# Patient Record
Sex: Male | Born: 1995 | Race: White | Hispanic: No | Marital: Single | Smoking: Never smoker
Health system: Southern US, Community
[De-identification: ages and names within clinical notes are randomized; demographics above are authoritative.]

---

## 2019-10-03 ENCOUNTER — Other Ambulatory Visit: Payer: Self-pay | Admitting: *Deleted

## 2019-10-03 DIAGNOSIS — Z20822 Contact with and (suspected) exposure to covid-19: Secondary | ICD-10-CM

## 2019-10-04 LAB — NOVEL CORONAVIRUS, NAA: SARS-CoV-2, NAA: NOT DETECTED

## 2020-02-14 ENCOUNTER — Ambulatory Visit
Admission: EM | Admit: 2020-02-14 | Discharge: 2020-02-14 | Disposition: A | Discharge status: Home / Self Care | Payer: Worker's Compensation | Attending: Emergency Medicine | Admitting: Emergency Medicine

## 2020-02-14 ENCOUNTER — Other Ambulatory Visit: Payer: Self-pay

## 2020-02-14 DIAGNOSIS — M545 Low back pain: Secondary | ICD-10-CM

## 2020-02-14 DIAGNOSIS — M79606 Pain in leg, unspecified: Secondary | ICD-10-CM

## 2020-02-14 MED ORDER — KETOROLAC TROMETHAMINE 60 MG/2ML IM SOLN
60.0000 mg | Freq: Once | INTRAMUSCULAR | Status: AC
  Administered 2020-02-14: 60 mg via INTRAMUSCULAR

## 2020-02-14 MED ORDER — IBUPROFEN 800 MG PO TABS: 800.0000 mg | ORAL_TABLET | Freq: Three times a day (TID) | ORAL | 0 refills | Status: DC

## 2020-02-14 MED ORDER — CYCLOBENZAPRINE HCL 10 MG PO TABS: 10.0000 mg | ORAL_TABLET | Freq: Two times a day (BID) | ORAL | 0 refills | Status: DC | PRN

## 2020-02-14 MED ORDER — PREDNISONE 10 MG PO TABS: 20.0000 mg | ORAL_TABLET | Freq: Every day | ORAL | 0 refills | Status: DC

## 2020-02-14 MED ORDER — METHYLPREDNISOLONE SODIUM SUCC 125 MG IJ SOLR
125.0000 mg | Freq: Once | INTRAMUSCULAR | Status: AC
  Administered 2020-02-14: 125 mg via INTRAMUSCULAR

## 2020-02-14 NOTE — ED Provider Notes (Signed)
[facility]    CSN: [id number] Arrival date & time: 02/14/20  1908      History   Chief Complaint Chief Complaint  Patient presents with  . Back Pain    HPI John Sims is a 24 y.o. male.   Who presented to the urgent care with a complaint of lower back pain that radiates to the lower bilateral leg for the past 5 days.  It started after he jumped off a platform that is 5 feet high.  He localizes the pain to the lower back.  He describes the pain as constant and achy.  He has tried OTC medications without relief.  His symptoms are made worse with ROM.  He denies similar symptoms in the past.    The history is provided by the patient. No language interpreter was used.  Back Pain   History reviewed. No pertinent past medical history.  There are no problems to display for this patient.   History reviewed. No pertinent surgical history.     Home Medications    Prior to Admission medications   Medication Sig Start Date End Date Taking? Authorizing Provider  cyclobenzaprine (FLEXERIL) 10 MG tablet Take 1 tablet (10 mg total) by mouth 2 (two) times daily as needed for muscle spasms. 02/14/20   Dannika Hilgeman, Darrelyn Hillock, FNP  ibuprofen (ADVIL) 800 MG tablet Take 1 tablet (800 mg total) by mouth 3 (three) times daily. Take with food 02/14/20   Arleene Settle, Darrelyn Hillock, FNP  predniSONE (DELTASONE) 10 MG tablet Take 2 tablets (20 mg total) by mouth daily. 02/14/20   Tran Arzuaga, Darrelyn Hillock, FNP    Family History No family history on file.  Social History Social History   Tobacco Use  . Smoking status: Never Smoker  . Smokeless tobacco: Never Used  Substance Use Topics  . Alcohol use: Yes    Comment: occ  . Drug use: Never     Allergies   Patient has no known allergies.   Review of Systems Review of Systems  Constitutional: Negative.   Respiratory: Negative.   Cardiovascular: Negative.   Musculoskeletal: Positive for back pain.  All other systems reviewed and are  negative.    Physical Exam Triage Vital Signs ED Triage Vitals  Enc Vitals Group     BP 02/14/20 1930 127/86     Pulse Rate 02/14/20 1930 90     Resp 02/14/20 1930 20     Temp 02/14/20 1930 99.1 F (37.3 C)     Temp Source 02/14/20 1930 Oral     SpO2 02/14/20 1930 97 %     Weight 02/14/20 1928 230 lb (104.3 kg)     Height 02/14/20 1928 6\' 1"  (1.854 m)     Head Circumference --      Peak Flow --      Pain Score 02/14/20 1928 8     Pain Loc --      Pain Edu? --      Excl. in [place]? --    No data found.  Updated Vital Signs BP 127/86 (BP Location: Left Arm)   Pulse 90   Temp 99.1 F (37.3 C) (Oral)   Resp 20   Ht 6\' 1"  (1.854 m)   Wt 230 lb (104.3 kg)   SpO2 97%   BMI 30.34 kg/m   Visual Acuity Right Eye Distance:   Left Eye Distance:   Bilateral Distance:    Right Eye Near:   Left Eye Near:  Bilateral Near:     Physical Exam Vitals and nursing note reviewed.  Constitutional:      General: He is not in acute distress.    Appearance: Normal appearance. He is normal weight. He is not ill-appearing, toxic-appearing or diaphoretic.  Cardiovascular:     Rate and Rhythm: Normal rate and regular rhythm.     Pulses: Normal pulses.     Heart sounds: Normal heart sounds. No murmur.  Pulmonary:     Effort: Pulmonary effort is normal. No respiratory distress.     Breath sounds: Normal breath sounds. No stridor. No wheezing, rhonchi or rales.  Chest:     Chest wall: No tenderness.  Musculoskeletal:        General: Tenderness present. Normal range of motion.     Cervical back: Normal.     Thoracic back: Normal.     Lumbar back: Spasms and tenderness present. No swelling or edema.     Right lower leg: No edema.     Left lower leg: No edema.  Neurological:     General: No focal deficit present.     Mental Status: He is alert and oriented to person, place, and time.     Cranial Nerves: Cranial nerves are intact.     Sensory: Sensation is intact.     Motor: Motor  function is intact.     Coordination: Coordination is intact.     Gait: Gait is intact.      UC Treatments / Results  Labs (all labs ordered are listed, but only abnormal results are displayed) Labs Reviewed - No data to display  EKG   Radiology No results found.  Procedures Procedures (including critical care time)  Medications Ordered in UC Medications  ketorolac (TORADOL) injection 60 mg (has no administration in time range)  methylPREDNISolone sodium succinate (SOLU-MEDROL) 125 mg/2 mL injection 125 mg (has no administration in time range)    Initial Impression / Assessment and Plan / UC Course  I have reviewed the triage vital signs and the nursing notes.  Pertinent labs & imaging results that were available during my care of the patient were reviewed by me and considered in my medical decision making (see chart for details).     Patient is stable at discharge. Ibuprofen was prescribed for pain Flexeril was prescribed Prednisone was prescribed Solu-Medrol 125 mg IM and Toradol 60 mg IM was given in office Advised patient to follow-up with primary care Return for worsening of symptoms   Final Clinical Impressions(s) / UC Diagnoses   Final diagnoses:  Low back pain radiating to lower extremity     Discharge Instructions     Rest, ice and heat as needed Ensure adequate ROM as tolerated. Prescribed ibuprofen as needed for inflammation and pain relief Prescribed flexeril  for muscle spasm.  Do not drive or operate heavy machinery while taking this medication Return here or go to ER if you have any new or worsening symptoms such as numbness/tingling of the inner thighs, loss of bladder or bowel control, headache/blurry vision, nausea/vomiting, confusion/altered mental status, dizziness, weakness, passing out, imbalance, etc...      ED Prescriptions    Medication Sig Dispense Auth. Provider   predniSONE (DELTASONE) 10 MG tablet Take 2 tablets (20 mg total)  by mouth daily. 15 tablet Dariela Stoker, Zachery Dakins, FNP   cyclobenzaprine (FLEXERIL) 10 MG tablet Take 1 tablet (10 mg total) by mouth 2 (two) times daily as needed for muscle spasms. 20 tablet Laurian Edrington, March Rummage  S, FNP   ibuprofen (ADVIL) 800 MG tablet Take 1 tablet (800 mg total) by mouth 3 (three) times daily. Take with food 42 tablet Torrence Hammack, Zachery Dakins, FNP     PDMP not reviewed this encounter.   Durward Parcel, FNP 02/14/20 1952

## 2020-02-14 NOTE — ED Triage Notes (Signed)
Pt reports was at work and jumped off of a platform that was approx 22feet high.  C/O pain in lower back radiating down both legs.

## 2020-02-14 NOTE — Discharge Instructions (Signed)
Rest, ice and heat as needed Ensure adequate ROM as tolerated. Prescribed ibuprofen as needed for inflammation and pain relief Prescribed flexeril  for muscle spasm.  Do not drive or operate heavy machinery while taking this medication Return here or go to ER if you have any new or worsening symptoms such as numbness/tingling of the inner thighs, loss of bladder or bowel control, headache/blurry vision, nausea/vomiting, confusion/altered mental status, dizziness, weakness, passing out, imbalance, etc...   

## 2021-01-18 ENCOUNTER — Ambulatory Visit
Admission: EM | Admit: 2021-01-18 | Discharge: 2021-01-18 | Disposition: A | Discharge status: Home / Self Care | Payer: Self-pay | Attending: Family Medicine | Admitting: Family Medicine

## 2021-01-18 ENCOUNTER — Other Ambulatory Visit: Payer: Self-pay

## 2021-01-18 ENCOUNTER — Encounter: Payer: Self-pay | Admitting: Emergency Medicine

## 2021-01-18 DIAGNOSIS — Z20822 Contact with and (suspected) exposure to covid-19: Secondary | ICD-10-CM

## 2021-01-18 DIAGNOSIS — J069 Acute upper respiratory infection, unspecified: Secondary | ICD-10-CM

## 2021-01-18 NOTE — ED Triage Notes (Signed)
Scratchy throat started on Tuesday.  Did home covid test that was negative.

## 2021-01-18 NOTE — ED Provider Notes (Signed)
[facility]   [id number] 01/18/21 Arrival Time: 1507   CC: COVID symptoms  SUBJECTIVE: History from: patient.  John Sims is a 25 y.o. male who presents with scratchy throat that started 5 days ago.  States that he had a negative home COVID test.  States that he feels nauseated and lightheaded right now.. Denies sick exposure to COVID, flu or strep. Denies recent travel. Has negative history of Covid. Has not completed Covid vaccines. Has not taken OTC cough and cold medications for this with some improvement. Denies previous symptoms in the past. Denies fever, chills, fatigue, sinus pain, rhinorrhea, SOB, wheezing, chest pain, nausea, changes in bowel or bladder habits.    ROS: As per HPI.  All other pertinent ROS negative.     History reviewed. No pertinent past medical history. History reviewed. No pertinent surgical history. No Known Allergies No current facility-administered medications on file prior to encounter.   Current Outpatient Medications on File Prior to Encounter  Medication Sig Dispense Refill   cyclobenzaprine (FLEXERIL) 10 MG tablet Take 1 tablet (10 mg total) by mouth 2 (two) times daily as needed for muscle spasms. 20 tablet 0   ibuprofen (ADVIL) 800 MG tablet Take 1 tablet (800 mg total) by mouth 3 (three) times daily. Take with food 42 tablet 0   predniSONE (DELTASONE) 10 MG tablet Take 2 tablets (20 mg total) by mouth daily. 15 tablet 0   Social History   Socioeconomic History   Marital status: Single    Spouse name: Not on file   Number of children: Not on file   Years of education: Not on file   Highest education level: Not on file  Occupational History   Not on file  Tobacco Use   Smoking status: Never Smoker   Smokeless tobacco: Never Used  Vaping Use   Vaping Use: Never used  Substance and Sexual Activity   Alcohol use: Yes    Comment: occ   Drug use: Never   Sexual activity: Not on file  Other Topics Concern    Not on file  Social History Narrative   Not on file   Social Determinants of Health   Financial Resource Strain: Not on file  Food Insecurity: Not on file  Transportation Needs: Not on file  Physical Activity: Not on file  Stress: Not on file  Social Connections: Not on file  Intimate Partner Violence: Not on file   History reviewed. No pertinent family history.  OBJECTIVE:  Vitals:   01/18/21 1526  BP: 117/77  Pulse: (!) 105  Resp: 18  Temp: 98.4 F (36.9 C)  TempSrc: Oral  SpO2: 96%     General appearance: alert; appears fatigued, but nontoxic; speaking in full sentences and tolerating own secretions HEENT: NCAT; Ears: EACs clear, TMs pearly gray; Eyes: PERRL.  EOM grossly intact. Sinuses: nontender; Nose: nares patent with clear rhinorrhea, Throat: oropharynx erythematous, cobblestoning present, tonsils non erythematous or enlarged, uvula midline  Neck: supple without LAD Lungs: unlabored respirations, symmetrical air entry; cough: absent; no respiratory distress; CTAB Heart: regular rate and rhythm.  Radial pulses 2+ symmetrical bilaterally Skin: warm and dry Psychological: alert and cooperative; normal mood and affect  LABS:  No results found for this or any previous visit (from the past 24 hour(s)).   ASSESSMENT & PLAN:  1. Viral URI with cough   2. Exposure to COVID-19 virus     Continue supportive care at home COVID and flu testing ordered.  It will  take between 2-3 days for test results. Someone will contact you regarding abnormal results.   Work note provided Patient should remain in quarantine until they have received Covid results.  If negative you may resume normal activities (go back to work/school) while practicing hand hygiene, social distance, and mask wearing.  If positive, patient should remain in quarantine for at least 5 days from symptom onset AND greater than 72 hours after symptoms resolution (absence of fever without the use of  fever-reducing medication and improvement in respiratory symptoms), whichever is longer Get plenty of rest and push fluids Use OTC zyrtec for nasal congestion, runny nose, and/or sore throat Use OTC flonase for nasal congestion and runny nose Use medications daily for symptom relief Use OTC medications like ibuprofen or tylenol as needed fever or pain Call or go to the ED if you have any new or worsening symptoms such as fever, worsening cough, shortness of breath, chest tightness, chest pain, turning blue, changes in mental status.  Reviewed expectations re: course of current medical issues. Questions answered. Outlined signs and symptoms indicating need for more acute intervention. Patient verbalized understanding. After Visit Summary given.         Moshe Cipro, NP 01/19/21 0930

## 2021-01-18 NOTE — ED Triage Notes (Signed)
Feels nauseated and lightheaded right now.

## 2021-01-18 NOTE — Discharge Instructions (Signed)
Your COVID test is pending.  You should self quarantine until the test result is back.    Take Tylenol or ibuprofen as needed for fever or discomfort.  Rest and keep yourself hydrated.    Follow-up with your primary care provider if your symptoms are not improving.     

## 2021-01-19 LAB — COVID-19, FLU A+B NAA
Influenza A, NAA: NOT DETECTED
Influenza B, NAA: NOT DETECTED
SARS-CoV-2, NAA: DETECTED — AB

## 2021-02-13 ENCOUNTER — Encounter: Payer: Self-pay | Admitting: Emergency Medicine

## 2021-02-13 ENCOUNTER — Other Ambulatory Visit: Payer: Self-pay

## 2021-02-13 ENCOUNTER — Ambulatory Visit
Admission: EM | Admit: 2021-02-13 | Discharge: 2021-02-13 | Disposition: A | Discharge status: Home / Self Care | Payer: Self-pay | Attending: Emergency Medicine | Admitting: Emergency Medicine

## 2021-02-13 DIAGNOSIS — K0889 Other specified disorders of teeth and supporting structures: Secondary | ICD-10-CM

## 2021-02-13 DIAGNOSIS — K029 Dental caries, unspecified: Secondary | ICD-10-CM

## 2021-02-13 DIAGNOSIS — K047 Periapical abscess without sinus: Secondary | ICD-10-CM

## 2021-02-13 MED ORDER — AMOXICILLIN-POT CLAVULANATE 875-125 MG PO TABS: 1.0000 | ORAL_TABLET | Freq: Two times a day (BID) | ORAL | 0 refills | Status: DC

## 2021-02-13 MED ORDER — NAPROXEN 500 MG PO TABS: 500.0000 mg | ORAL_TABLET | Freq: Two times a day (BID) | ORAL | 0 refills | Status: DC

## 2021-02-13 MED ORDER — CHLORHEXIDINE GLUCONATE 0.12 % MT SOLN: 15.0000 mL | Freq: Two times a day (BID) | OROMUCOSAL | 0 refills | Status: DC

## 2021-02-13 NOTE — ED Provider Notes (Signed)
[facility]   [id number] 02/13/21 Arrival Time: 1016  CC: DENTAL PAIN  SUBJECTIVE:  John Sims is a 25 y.o. male who presents to the urgent care with a complaint of dental abscess for the past few days.  Denies a precipitating event or trauma.  Localizes pain to right lower gum.  Has tried OTC analgesics without relief.  Worse with chewing.  Does not see a dentist regularly.  Report similar symptoms in the past.  Denies fever, chills, dysphagia, odynophagia, oral or neck swelling, nausea, vomiting, chest pain, SOB.    ROS: As per HPI.  All other pertinent ROS negative.     History reviewed. No pertinent past medical history. History reviewed. No pertinent surgical history. No Known Allergies No current facility-administered medications on file prior to encounter.   Current Outpatient Medications on File Prior to Encounter  Medication Sig Dispense Refill  . cyclobenzaprine (FLEXERIL) 10 MG tablet Take 1 tablet (10 mg total) by mouth 2 (two) times daily as needed for muscle spasms. 20 tablet 0  . ibuprofen (ADVIL) 800 MG tablet Take 1 tablet (800 mg total) by mouth 3 (three) times daily. Take with food 42 tablet 0  . predniSONE (DELTASONE) 10 MG tablet Take 2 tablets (20 mg total) by mouth daily. 15 tablet 0   Social History   Socioeconomic History  . Marital status: Single    Spouse name: Not on file  . Number of children: Not on file  . Years of education: Not on file  . Highest education level: Not on file  Occupational History  . Not on file  Tobacco Use  . Smoking status: Never Smoker  . Smokeless tobacco: Never Used  Vaping Use  . Vaping Use: Never used  Substance and Sexual Activity  . Alcohol use: Yes    Comment: occ  . Drug use: Never  . Sexual activity: Not on file  Other Topics Concern  . Not on file  Social History Narrative  . Not on file   Social Determinants of Health   Financial Resource Strain: Not on file  Food Insecurity: Not on  file  Transportation Needs: Not on file  Physical Activity: Not on file  Stress: Not on file  Social Connections: Not on file  Intimate Partner Violence: Not on file   No family history on file.  OBJECTIVE:  Vitals:   02/13/21 1036  BP: 116/70  Pulse: 78  Resp: 18  Temp: 98.5 F (36.9 C)  TempSrc: Oral  SpO2: 98%    Physical Exam Vitals and nursing note reviewed.  Constitutional:      General: He is not in acute distress.    Appearance: Normal appearance. He is normal weight. He is not ill-appearing, toxic-appearing or diaphoretic.  HENT:     Head: Normocephalic.     Right Ear: Tympanic membrane, ear canal and external ear normal.     Left Ear: Tympanic membrane, ear canal and external ear normal.     Mouth/Throat:     Lips: Pink.     Mouth: Mucous membranes are moist.     Dentition: Abnormal dentition. Dental caries and dental abscesses present.     Pharynx: Uvula midline.     Tonsils: No tonsillar exudate or tonsillar abscesses. 1+ on the right. 1+ on the left.  Cardiovascular:     Rate and Rhythm: Normal rate and regular rhythm.     Pulses: Normal pulses.     Heart sounds: Normal heart sounds. No murmur  heard. No friction rub. No gallop.   Pulmonary:     Effort: Pulmonary effort is normal. No respiratory distress.     Breath sounds: Normal breath sounds. No stridor. No wheezing, rhonchi or rales.  Chest:     Chest wall: No tenderness.  Neurological:     Mental Status: He is alert and oriented to person, place, and time.      ASSESSMENT & PLAN:  1. Pain, dental   2. Dental abscess   3. Dental caries     Meds ordered this encounter  Medications  . naproxen (NAPROSYN) 500 MG tablet    Sig: Take 1 tablet (500 mg total) by mouth 2 (two) times daily.    Dispense:  30 tablet    Refill:  0  . amoxicillin-clavulanate (AUGMENTIN) 875-125 MG tablet    Sig: Take 1 tablet by mouth every 12 (twelve) hours.    Dispense:  14 tablet    Refill:  0  .  chlorhexidine (PERIDEX) 0.12 % solution    Sig: Use as directed 15 mLs in the mouth or throat 2 (two) times daily.    Dispense:  120 mL    Refill:  0    Discharge Instructions  Naproxen prescribed.  Use as directed for pain relief Chlorhexidine and Augmentin were prescribed Recommend soft diet until evaluated by dentist Maintain oral hygiene care Follow up with dentist as soon as possible for further evaluation and treatment  Return or go to the ED if you have any new or worsening symptoms such as fever, chills, difficulty swallowing, painful swallowing, oral or neck swelling, nausea, vomiting, chest pain, SOB, etc...  Reviewed expectations re: course of current medical issues. Questions answered. Outlined signs and symptoms indicating need for more acute intervention. Patient verbalized understanding. After Visit Summary given.   Durward Parcel, FNP 02/13/21 1100

## 2021-02-13 NOTE — Discharge Instructions (Addendum)
Naproxen prescribed.  Use as directed for pain relief Chlorhexidine and Augmentin were prescribed Recommend soft diet until evaluated by dentist Maintain oral hygiene care Follow up with dentist as soon as possible for further evaluation and treatment  Return or go to the ED if you have any new or worsening symptoms such as fever, chills, difficulty swallowing, painful swallowing, oral or neck swelling, nausea, vomiting, chest pain, SOB, etc..Marland Kitchen

## 2021-02-13 NOTE — ED Triage Notes (Signed)
Dental abscess to RT upper side since last night

## 2022-03-14 ENCOUNTER — Other Ambulatory Visit: Payer: Self-pay

## 2022-03-14 ENCOUNTER — Emergency Department (HOSPITAL_COMMUNITY)
Admission: EM | Admit: 2022-03-14 | Discharge: 2022-03-15 | Disposition: A | Discharge status: Home / Self Care | Payer: Self-pay | Attending: Emergency Medicine | Admitting: Emergency Medicine

## 2022-03-14 ENCOUNTER — Encounter (HOSPITAL_COMMUNITY): Payer: Self-pay | Admitting: *Deleted

## 2022-03-14 DIAGNOSIS — Z79899 Other long term (current) drug therapy: Secondary | ICD-10-CM | POA: Insufficient documentation

## 2022-03-14 DIAGNOSIS — R519 Headache, unspecified: Secondary | ICD-10-CM | POA: Insufficient documentation

## 2022-03-14 DIAGNOSIS — R791 Abnormal coagulation profile: Secondary | ICD-10-CM | POA: Insufficient documentation

## 2022-03-14 DIAGNOSIS — H532 Diplopia: Secondary | ICD-10-CM | POA: Insufficient documentation

## 2022-03-14 NOTE — ED Triage Notes (Signed)
Pt with double vision to right eye since this morning.  Pt has been seen by urgent Care in [place] and was discharge and sent to a hospital to be assessed.  ?

## 2022-03-15 ENCOUNTER — Emergency Department (HOSPITAL_COMMUNITY): Payer: Self-pay

## 2022-03-15 DIAGNOSIS — Z0100 Encounter for examination of eyes and vision without abnormal findings: Secondary | ICD-10-CM

## 2022-03-15 LAB — URINALYSIS, ROUTINE W REFLEX MICROSCOPIC
Bilirubin Urine: NEGATIVE
Glucose, UA: NEGATIVE mg/dL
Hgb urine dipstick: NEGATIVE
Ketones, ur: NEGATIVE mg/dL
Leukocytes,Ua: NEGATIVE
Nitrite: NEGATIVE
Protein, ur: NEGATIVE mg/dL
Specific Gravity, Urine: >1.046 — ABNORMAL HIGH (ref 1.005–1.030)
pH: 6 (ref 5.0–8.0)

## 2022-03-15 LAB — DIFFERENTIAL
Abs Immature Granulocytes: 0.02 10*3/uL (ref 0.00–0.07)
Basophils Absolute: 0.1 10*3/uL (ref 0.0–0.1)
Basophils Relative: 1 %
Eosinophils Absolute: 0.2 10*3/uL (ref 0.0–0.5)
Eosinophils Relative: 2 %
Immature Granulocytes: 0 %
Lymphocytes Relative: 36 %
Lymphs Abs: 3.2 10*3/uL (ref 0.7–4.0)
Monocytes Absolute: 0.6 10*3/uL (ref 0.1–1.0)
Monocytes Relative: 7 %
Neutro Abs: 4.9 10*3/uL (ref 1.7–7.7)
Neutrophils Relative %: 54 %

## 2022-03-15 LAB — COMPREHENSIVE METABOLIC PANEL
ALT: 29 U/L (ref 0–44)
AST: 23 U/L (ref 15–41)
Albumin: 4.7 g/dL (ref 3.5–5.0)
Alkaline Phosphatase: 84 U/L (ref 38–126)
Anion gap: 9 (ref 5–15)
BUN: 14 mg/dL (ref 6–20)
CO2: 26 mmol/L (ref 22–32)
Calcium: 9.3 mg/dL (ref 8.9–10.3)
Chloride: 101 mmol/L (ref 98–111)
Creatinine, Ser: 0.9 mg/dL (ref 0.61–1.24)
GFR, Estimated: >60 mL/min (ref >60)
Glucose, Bld: 96 mg/dL (ref 70–99)
Potassium: 3.7 mmol/L (ref 3.5–5.1)
Sodium: 136 mmol/L (ref 135–145)
Total Bilirubin: 0.6 mg/dL (ref 0.3–1.2)
Total Protein: 8 g/dL (ref 6.5–8.1)

## 2022-03-15 LAB — I-STAT CHEM 8, ED
BUN: 14 mg/dL (ref 6–20)
Calcium, Ion: 1.22 mmol/L (ref 1.15–1.40)
Chloride: 102 mmol/L (ref 98–111)
Creatinine, Ser: 1 mg/dL (ref 0.61–1.24)
Glucose, Bld: 93 mg/dL (ref 70–99)
HCT: 45 % (ref 39.0–52.0)
Hemoglobin: 15.3 g/dL (ref 13.0–17.0)
Potassium: 3.9 mmol/L (ref 3.5–5.1)
Sodium: 138 mmol/L (ref 135–145)
TCO2: 29 mmol/L (ref 22–32)

## 2022-03-15 LAB — CBC
HCT: 47.1 % (ref 39.0–52.0)
Hemoglobin: 15.3 g/dL (ref 13.0–17.0)
MCH: 28.9 pg (ref 26.0–34.0)
MCHC: 32.5 g/dL (ref 30.0–36.0)
MCV: 88.9 fL (ref 80.0–100.0)
Platelets: 250 10*3/uL (ref 150–400)
RBC: 5.3 MIL/uL (ref 4.22–5.81)
RDW: 12.7 % (ref 11.5–15.5)
WBC: 9 10*3/uL (ref 4.0–10.5)
nRBC: 0 % (ref 0.0–0.2)

## 2022-03-15 LAB — RAPID URINE DRUG SCREEN, HOSP PERFORMED
Amphetamines: NOT DETECTED
Barbiturates: NOT DETECTED
Benzodiazepines: NOT DETECTED
Cocaine: NOT DETECTED
Opiates: NOT DETECTED
Tetrahydrocannabinol: NOT DETECTED

## 2022-03-15 LAB — PROTIME-INR
INR: 1 (ref 0.8–1.2)
Prothrombin Time: 12.9 seconds (ref 11.4–15.2)

## 2022-03-15 LAB — ETHANOL: Alcohol, Ethyl (B): <10 mg/dL (ref <10)

## 2022-03-15 LAB — APTT: aPTT: 48 seconds — ABNORMAL HIGH (ref 24–36)

## 2022-03-15 IMAGING — CT CT ANGIO HEAD-NECK (W OR W/O PERF)
1 of 11 series · 5 of 33 positions shown · IV contrast (Omnipaque or Isovue)
Comparison: None.

CLINICAL DATA: Initial evaluation for stroke suspected, right-sided
double vision.

EXAM:
CT ANGIOGRAPHY HEAD AND NECK
TECHNIQUE: Multidetector CT imaging of the head and neck was performed using
the standard protocol during bolus administration of intravenous
contrast. Multiplanar CT image reconstructions and MIPs were
obtained to evaluate the vascular anatomy. Carotid stenosis
measurements (when applicable) are obtained utilizing NASCET
criteria, using the distal internal carotid diameter as the
denominator.

[Series 12: ax thins · axial · 0.43mm/px · z∈[-186,+81]mm · 5 of 408 slices shown]
[im 68/408  soft-tissue]
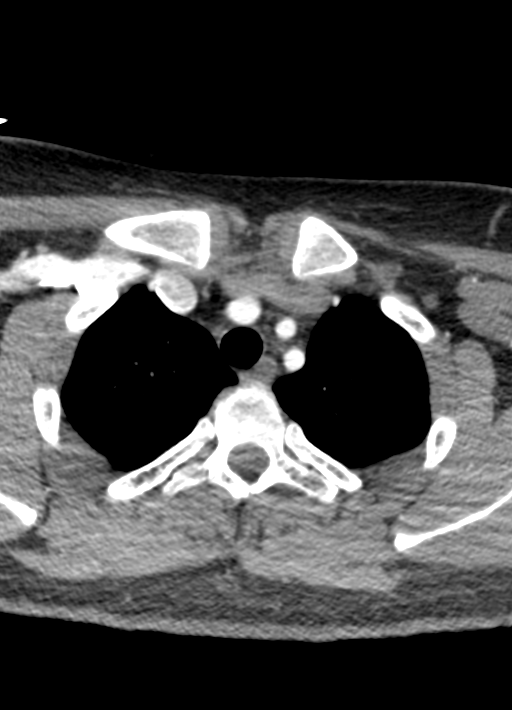
[im 136/408  bone]
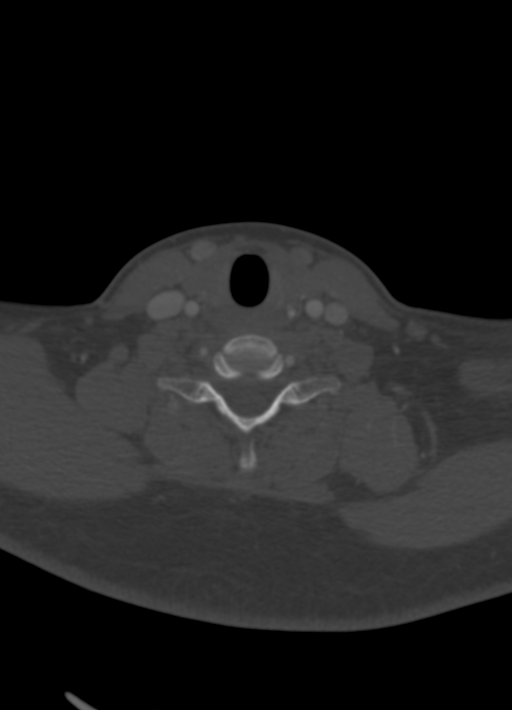
[im 204/408  soft-tissue]
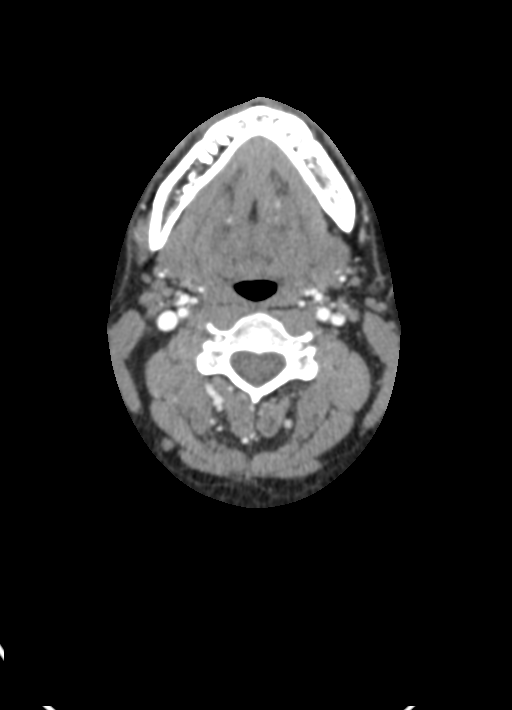
[im 272/408  bone]
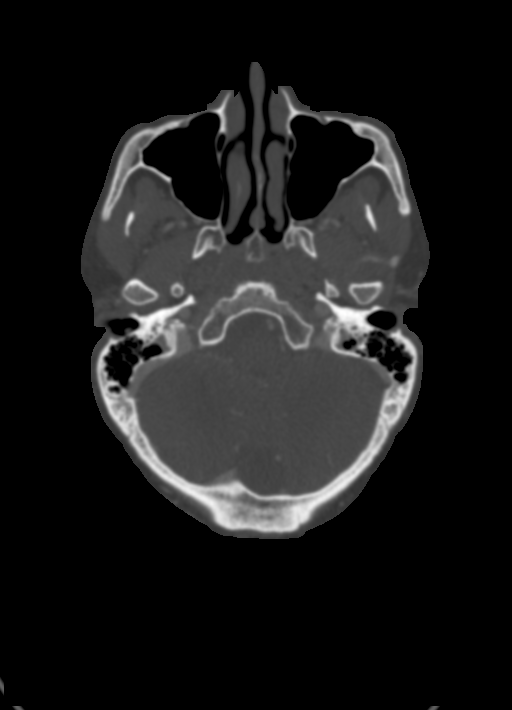
[im 340/408  soft-tissue]
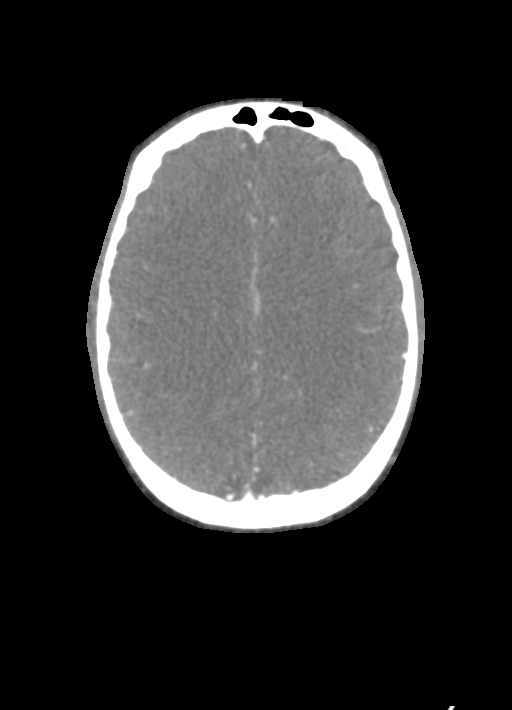

[5 of 33 positions shown; findings below may reference images not displayed]

RADIATION DOSE REDUCTION: This exam was performed according to the
departmental dose-optimization program which includes automated
exposure control, adjustment of the mA and/or kV according to
patient size and/or use of iterative reconstruction technique.

CONTRAST:  75mL OMNIPAQUE IOHEXOL 350 MG/ML SOLN
FINDINGS: CT HEAD FINDINGS

Brain: Cerebral volume within normal limits for patient age.

No evidence for acute intracranial hemorrhage. No findings to
suggest acute large vessel territory infarct. No mass lesion,
midline shift, or mass effect. Ventricles are normal in size without
evidence for hydrocephalus. No extra-axial fluid collection
identified. Empty sella noted. Retro cerebellar cyst versus mega
cisterna magna noted as well.

Vascular: No hyperdense vessel identified.

Skull: Scalp soft tissues demonstrate no acute abnormality.
Calvarium intact.

Sinuses/Orbits: Globes and orbital soft tissues within normal
limits.

Visualized paranasal sinuses are clear. No mastoid effusion.

CTA NECK FINDINGS

Aortic arch: Visualized aortic arch normal caliber with normal
branch pattern. No stenosis about the origin the great vessels.

Right carotid system: Right common and internal carotid arteries
widely patent without stenosis, dissection or occlusion.

Left carotid system: Left common and internal carotid arteries
widely patent without stenosis, dissection or occlusion.

Vertebral arteries: Both vertebral arteries arise from the
subclavian arteries. No proximal subclavian artery stenosis. Both
vertebral arteries widely patent without stenosis, dissection or
occlusion.

Skeleton: Unremarkable.

Other neck: Unremarkable.

Upper chest: Unremarkable.

Review of the MIP images confirms the above findings

CTA HEAD FINDINGS

Anterior circulation: Both internal carotid arteries widely patent
to the termini without stenosis. A1 segments widely patent. Right A1
hypoplastic. Normal anterior communicating artery complex. Both
anterior cerebral arteries widely patent to their distal aspects
without stenosis. No M1 stenosis or occlusion. Normal MCA
bifurcations. Distal MCA branches well perfused and symmetric.

Posterior circulation: Both vertebral arteries patent to the
vertebrobasilar junction without stenosis. Both PICA patent. Basilar
patent to its distal aspect without stenosis. Superior cerebellar
arteries patent bilaterally. Right PCA supplied via the basilar.
Fetal type origin left PCA. Both PCAs well perfused or distal
aspects.

Venous sinuses: Patent allowing for timing the contrast bolus.

Anatomic variants: As above.  No intracranial aneurysm.

Review of the MIP images confirms the above findings
IMPRESSION: 1. Normal CTA of the head and neck. No large vessel occlusion,
hemodynamically significant stenosis, or other acute vascular
abnormality.
2. No other acute intracranial abnormality.
3. Empty sella.

## 2022-03-15 IMAGING — MR MR ORBITS WO/W CM
4 of 6 series · 18 of 48 positions shown · IV contrast (gadavist)
Comparison: CTA head and neck [Y9] hours today.

CLINICAL DATA: 25-year-old male with diplopia worse on the right
side.

EXAM:
MRI HEAD AND ORBITS WITHOUT AND WITH CONTRAST
TECHNIQUE: Multiplanar, multiecho pulse sequences of the brain and surrounding
structures were obtained without and with intravenous contrast.
Multiplanar, multiecho pulse sequences of the orbits and surrounding
structures were obtained including fat saturation techniques, before
and after intravenous contrast administration.
CONTRAST:  10mL GADAVIST GADOBUTROL 1 MMOL/ML IV SOLN

[Series 9: T2 fat-sat · coronal · 4.0mm · 0.35mm/px · 8 of 23 slices shown (1 of 2)]
[im 1/23]
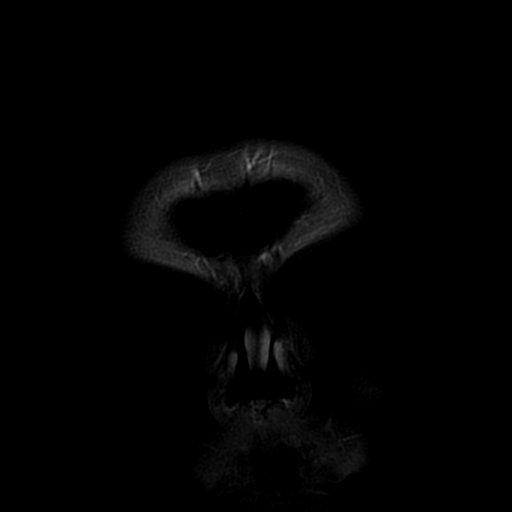
[im 4/23]
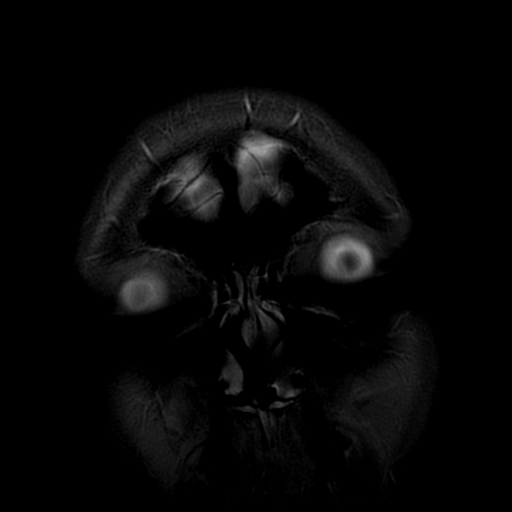
[im 7/23]
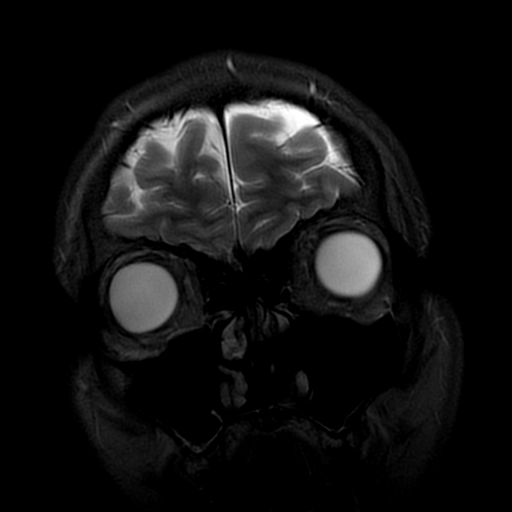
[im 10/23]
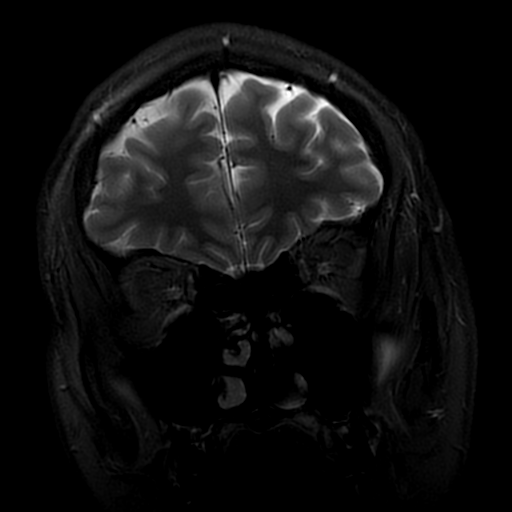
[im 13/23]
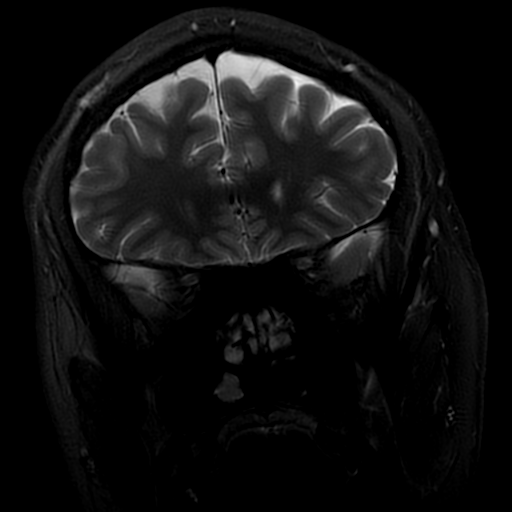
[im 16/23]
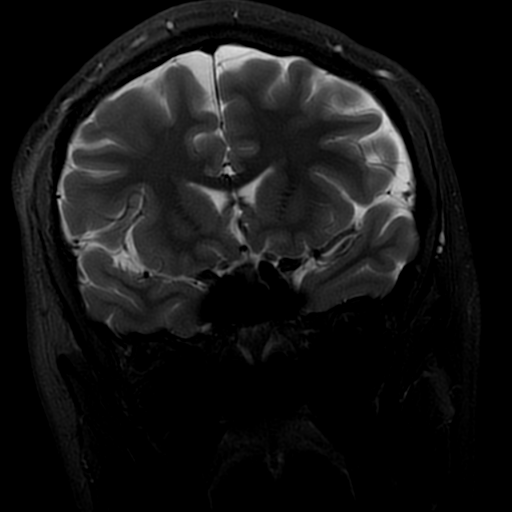
[im 19/23]
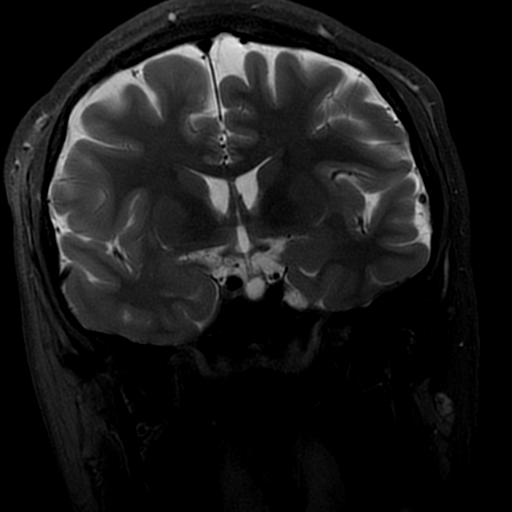
[im 23/23]
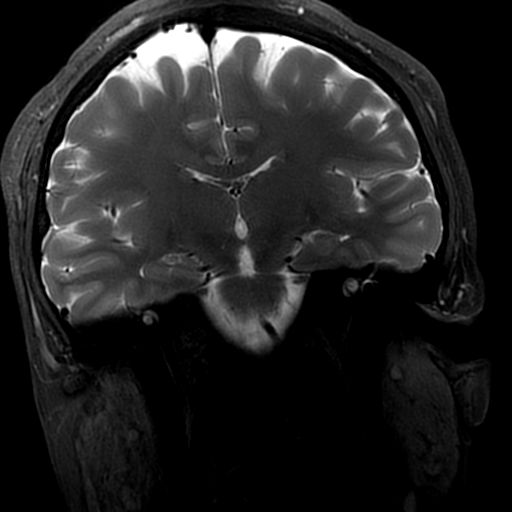

[Series 10: T2 fat-sat · axial · 3.0mm · 0.35mm/px · z∈[-42,+11]mm · 4 of 23 slices shown (2 of 2)]
[im 1/23]
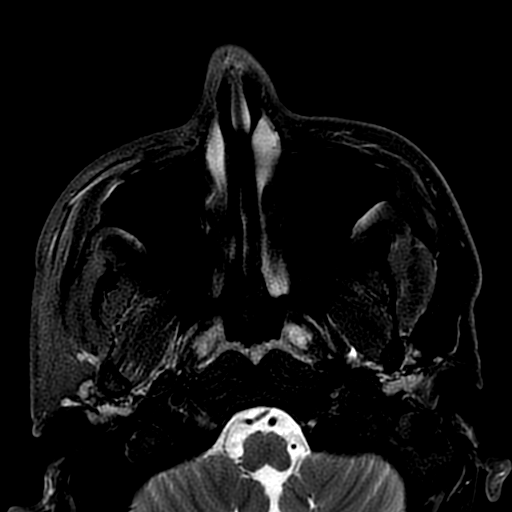
[im 4/23]
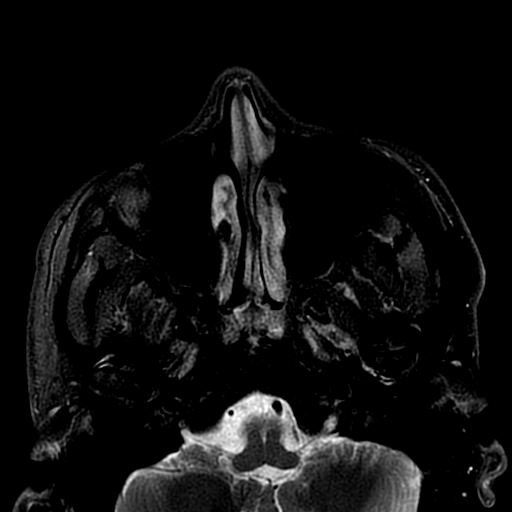
[im 13/23]
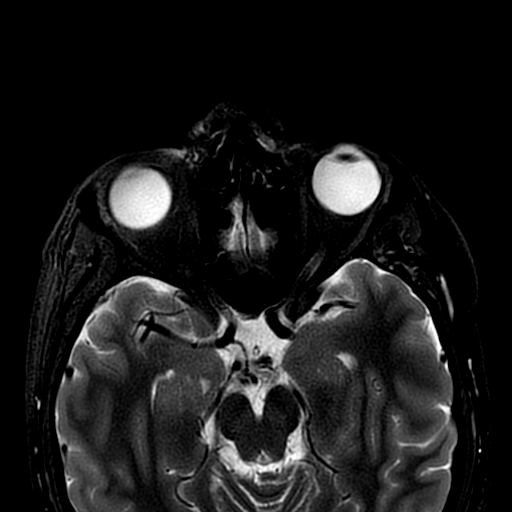
[im 19/23]
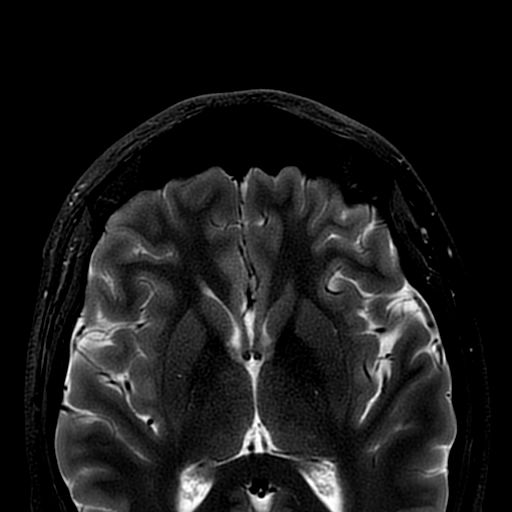

[Series 11: T1 · coronal · 4.0mm · 0.35mm/px · 3 of 23 slices shown (1 of 2)]
[im 4/23]
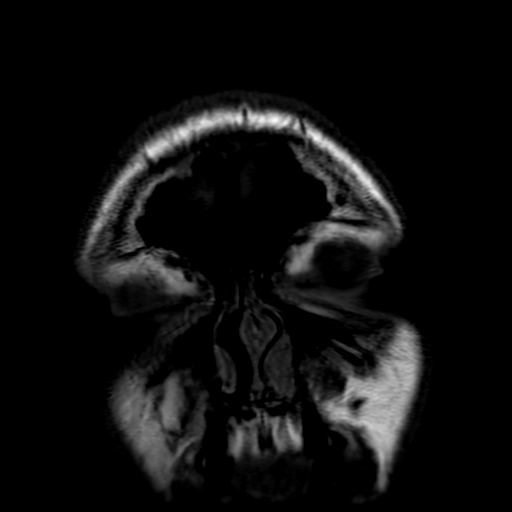
[im 13/23]
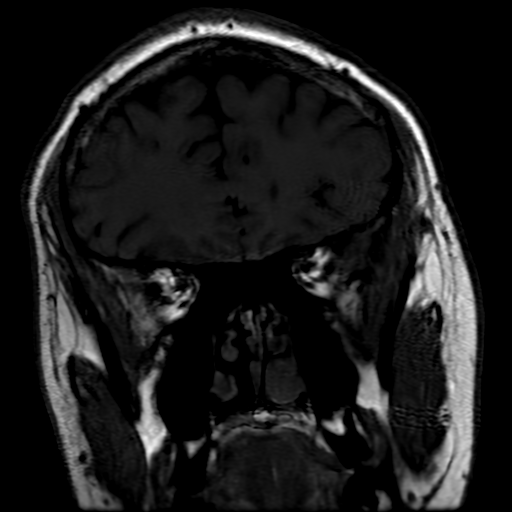
[im 19/23]
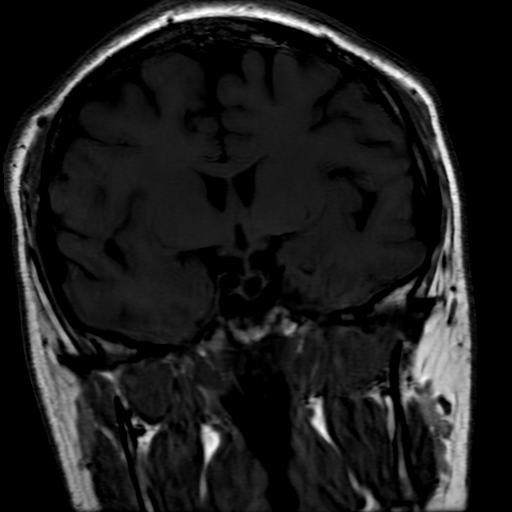

[Series 12: T1 · axial · 3.0mm · 0.35mm/px · z∈[-33,+11]mm · 3 of 23 slices shown (2 of 2)]
[im 4/23]
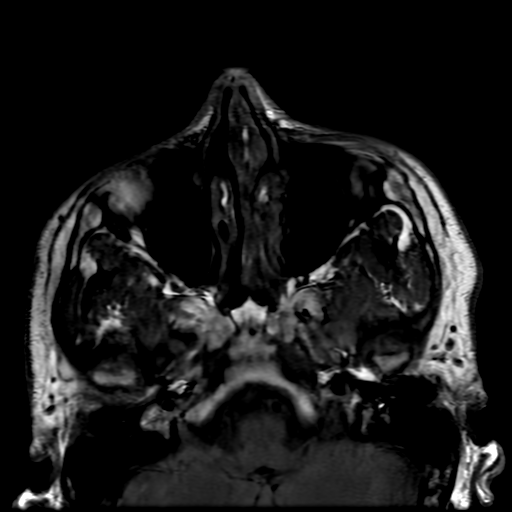
[im 13/23]
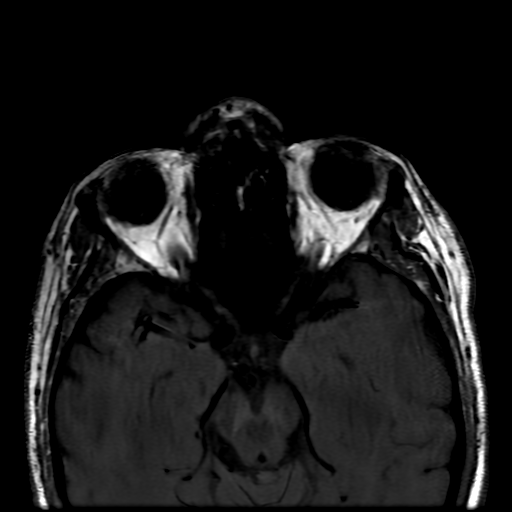
[im 19/23]
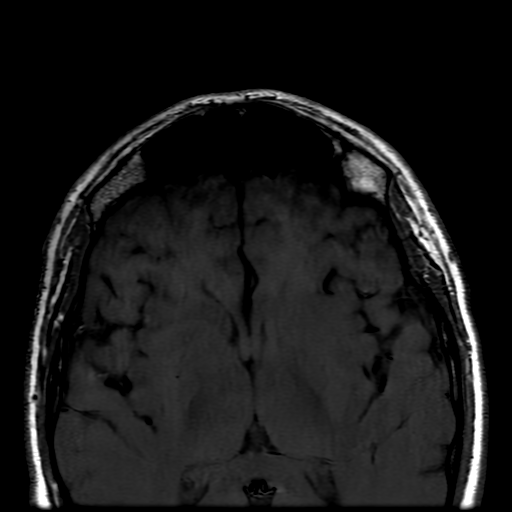

[18 of 48 positions shown; findings below may reference images not displayed]

FINDINGS: MRI HEAD FINDINGS

Brain: Partially empty sella (series 4, image 13).

No restricted diffusion to suggest acute infarction. No midline
shift, mass effect, evidence of mass lesion, ventriculomegaly,
extra-axial collection or acute intracranial hemorrhage.
Cervicomedullary junction is within normal limits.

Gray and white matter signal is within normal limits throughout the
brain. No white matter lesions, encephalomalacia, or chronic
cerebral blood products identified.

No abnormal enhancement identified. No dural thickening.

Vascular: Major intracranial vascular flow voids are preserved. The
major dural venous sinuses are enhancing and appear to be patent.

Skull and upper cervical spine: Negative visible cervical spine.
Normal bone marrow signal.

Other: Mastoids are clear. Visible internal auditory structures
appear normal.

MRI ORBITS FINDINGS

Orbits: Capacious suprasellar cistern. Normal optic chiasm.
Partially empty sella. Cavernous sinuses appear within normal
limits.

Bilateral optic nerves appear symmetric, with no abnormal T2
hyperintensity or enhancement. No increased CSF in the optic nerve
root sleeves.

There is Disconjugate gaze, but otherwise globes appear symmetric
and normal. No intraorbital mass, inflammation, or abnormal
enhancement identified. Lacrimal glands appear symmetric and within
normal limits.

Visualized sinuses: Clear.

Soft tissues: Negative visible face.
IMPRESSION: 1. Disconjugate gaze, but otherwise normal MRI appearance of the
Orbits.
2. Normal MRI appearance of the brain aside from partially empty
sella, often a normal anatomic variant but can be associated with
idiopathic intracranial hypertension (pseudotumor cerebri).

## 2022-03-15 IMAGING — MR MR HEAD WO/W CM
7 of 13 series · 21 of 48 positions shown · IV contrast (gadavist)
Comparison: CTA head and neck [Y9] hours today.

CLINICAL DATA: 25-year-old male with diplopia worse on the right
side.

EXAM:
MRI HEAD AND ORBITS WITHOUT AND WITH CONTRAST
TECHNIQUE: Multiplanar, multiecho pulse sequences of the brain and surrounding
structures were obtained without and with intravenous contrast.
Multiplanar, multiecho pulse sequences of the orbits and surrounding
structures were obtained including fat saturation techniques, before
and after intravenous contrast administration.
CONTRAST:  10mL GADAVIST GADOBUTROL 1 MMOL/ML IV SOLN

[Series 2: DWI · axial · 3.0mm · 0.94mm/px · z∈[-62,+86]mm · 6 of 101 slices shown (1 of 2)]
[im 1/101]
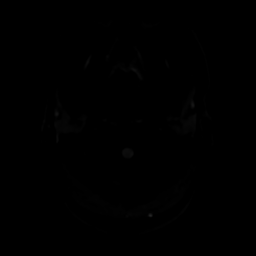
[im 21/101]
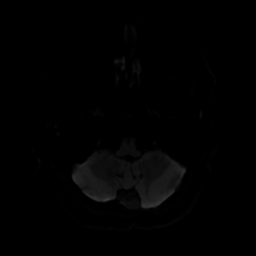
[im 41/101]
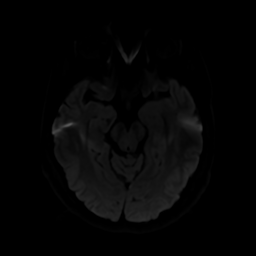
[im 61/101]
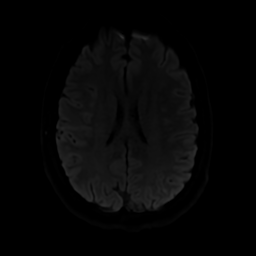
[im 81/101]
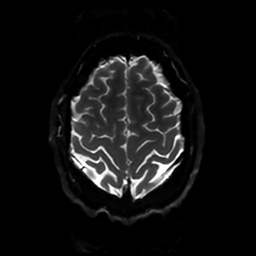
[im 101/101]
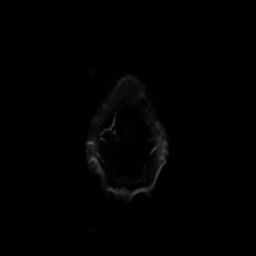

[Series 3: DWI · coronal · 4.0mm · 0.94mm/px · 5 of 74 slices shown (2 of 2)]
[im 1/74]
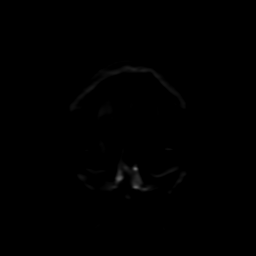
[im 19/74]
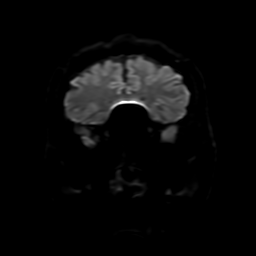
[im 37/74]
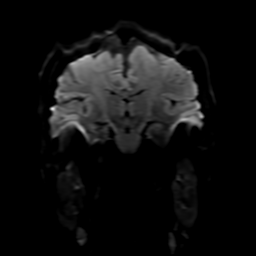
[im 55/74]
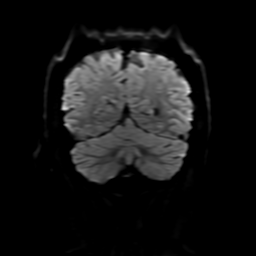
[im 74/74]
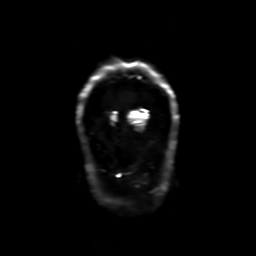

[Series 4: FLAIR · sagittal · 5.0mm · 0.23mm/px · 2 of 26 slices shown (1 of 2)]
[im 1/26]
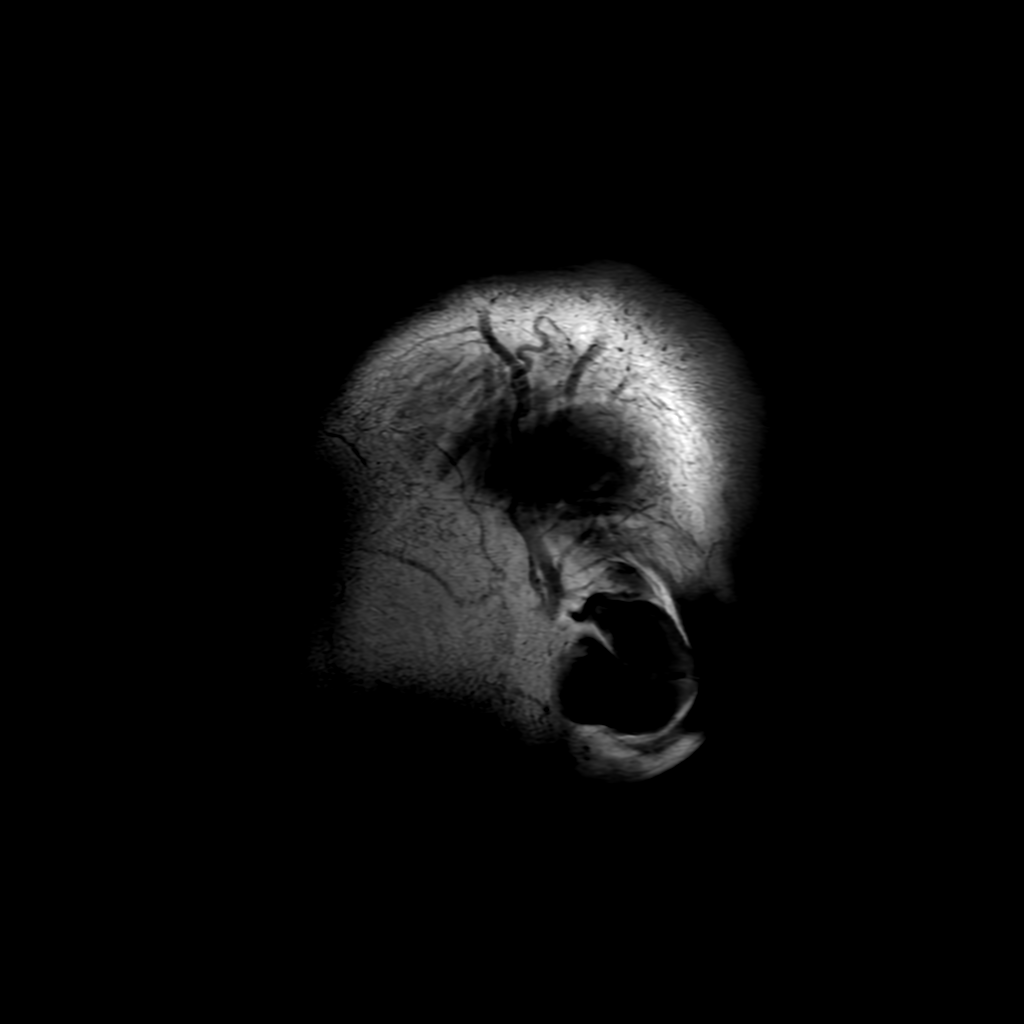
[im 26/26]
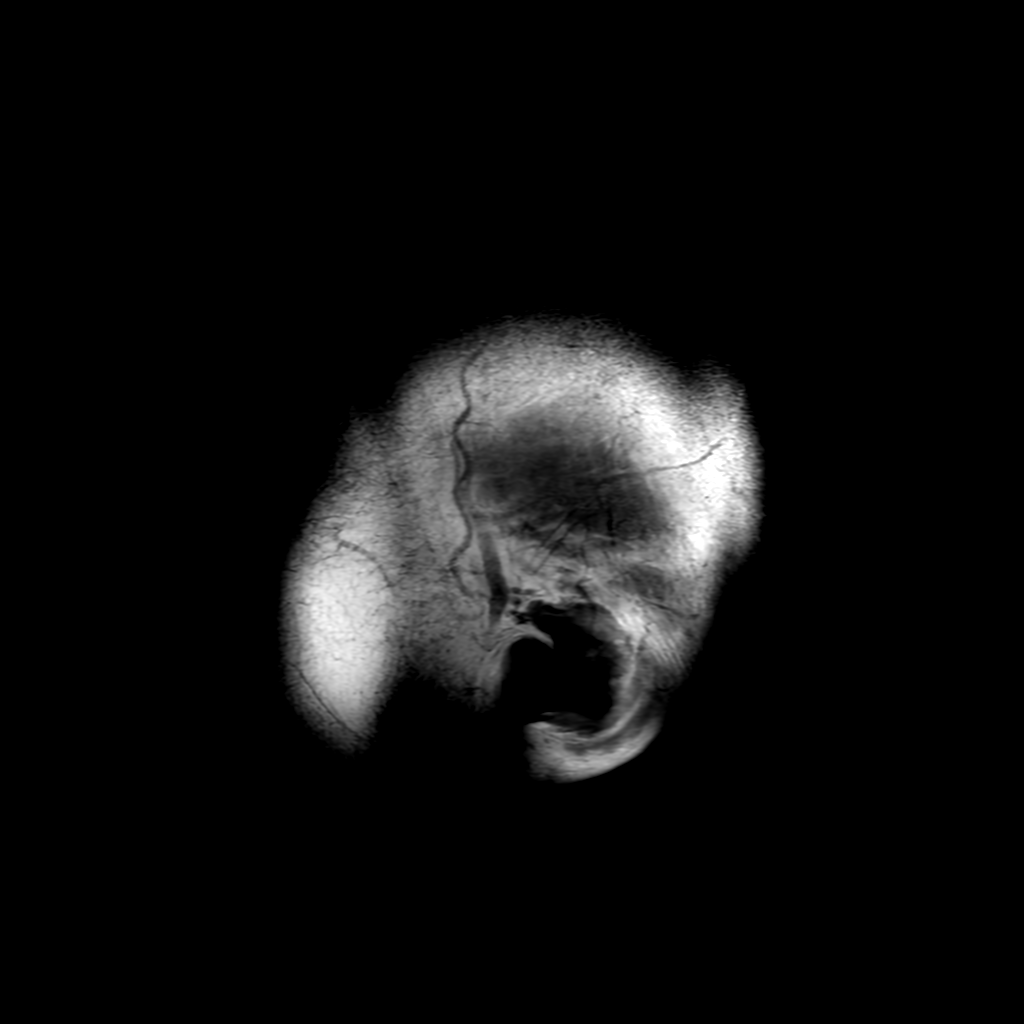

[Series 5: T2 · axial · 5.0mm · 0.23mm/px · 1 of 27 slices shown]
[im 1/27]
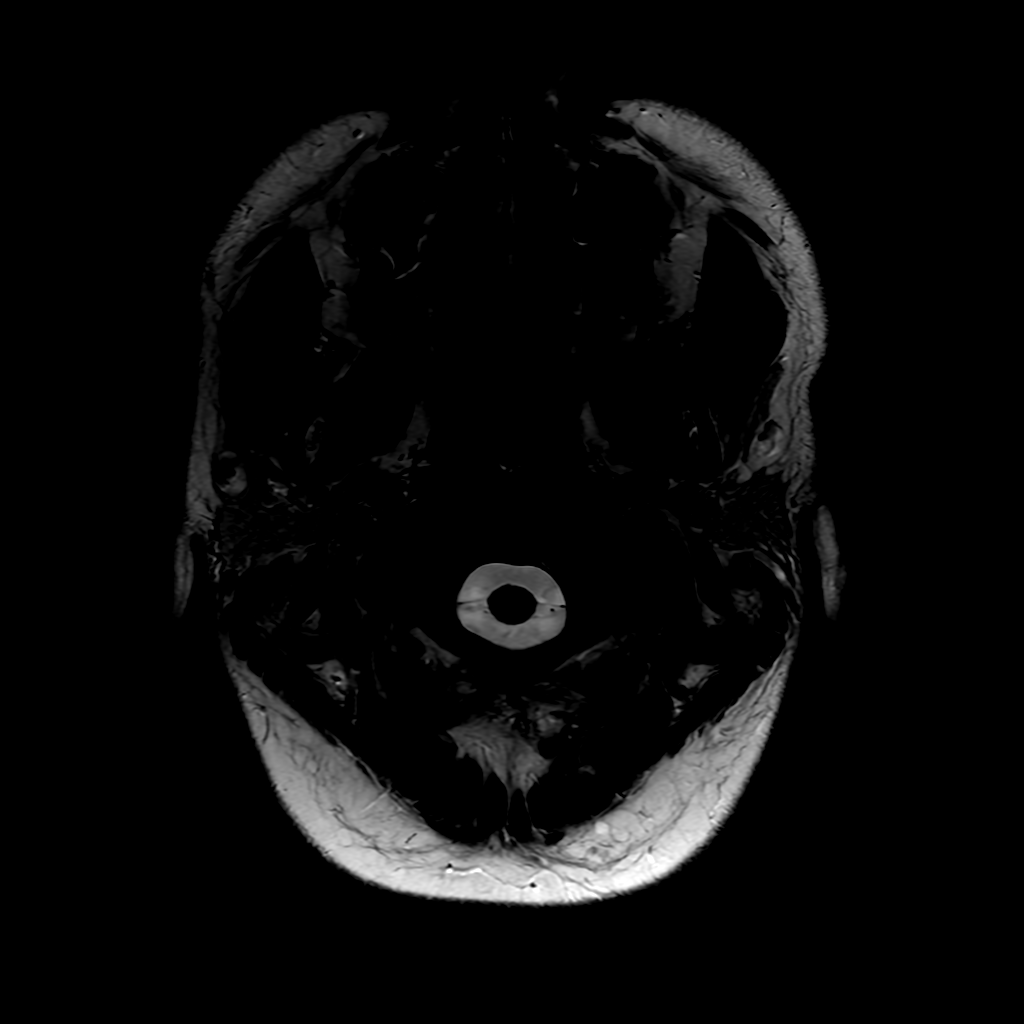

[Series 6: FLAIR · axial · 5.0mm · 0.47mm/px · z∈[-62,+93]mm · 2 of 27 slices shown (2 of 2)]
[im 1/27]
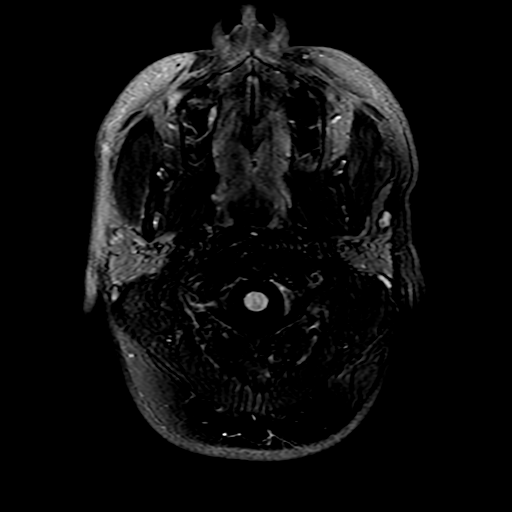
[im 27/27]
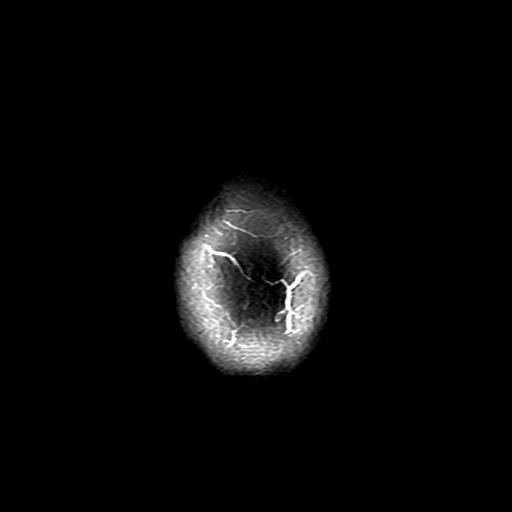

[Series 250: ADC · axial · 3.0mm · 0.94mm/px · z∈[-62,+86]mm · 3 of 51 slices shown (1 of 2)]
[im 1/51]
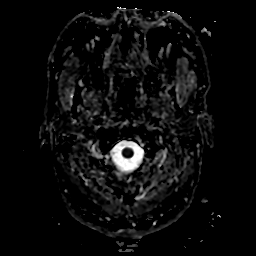
[im 26/51]
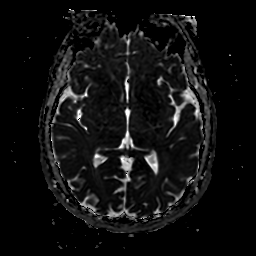
[im 51/51]
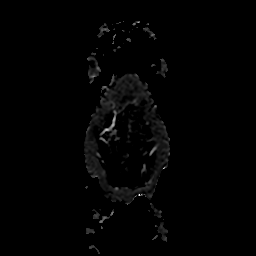

[Series 350: ADC · coronal · 4.0mm · 0.94mm/px · 2 of 37 slices shown (2 of 2)]
[im 1/37]
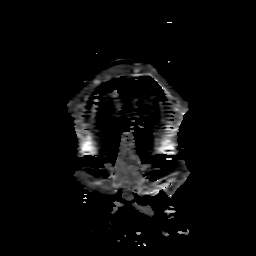
[im 37/37]
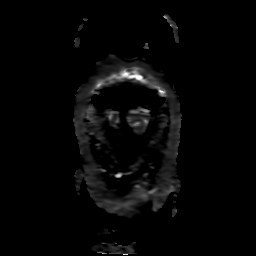

[21 of 48 positions shown; findings below may reference images not displayed]

FINDINGS: MRI HEAD FINDINGS

Brain: Partially empty sella (series 4, image 13).

No restricted diffusion to suggest acute infarction. No midline
shift, mass effect, evidence of mass lesion, ventriculomegaly,
extra-axial collection or acute intracranial hemorrhage.
Cervicomedullary junction is within normal limits.

Gray and white matter signal is within normal limits throughout the
brain. No white matter lesions, encephalomalacia, or chronic
cerebral blood products identified.

No abnormal enhancement identified. No dural thickening.

Vascular: Major intracranial vascular flow voids are preserved. The
major dural venous sinuses are enhancing and appear to be patent.

Skull and upper cervical spine: Negative visible cervical spine.
Normal bone marrow signal.

Other: Mastoids are clear. Visible internal auditory structures
appear normal.

MRI ORBITS FINDINGS

Orbits: Capacious suprasellar cistern. Normal optic chiasm.
Partially empty sella. Cavernous sinuses appear within normal
limits.

Bilateral optic nerves appear symmetric, with no abnormal T2
hyperintensity or enhancement. No increased CSF in the optic nerve
root sleeves.

There is Disconjugate gaze, but otherwise globes appear symmetric
and normal. No intraorbital mass, inflammation, or abnormal
enhancement identified. Lacrimal glands appear symmetric and within
normal limits.

Visualized sinuses: Clear.

Soft tissues: Negative visible face.
IMPRESSION: 1. Disconjugate gaze, but otherwise normal MRI appearance of the
Orbits.
2. Normal MRI appearance of the brain aside from partially empty
sella, often a normal anatomic variant but can be associated with
idiopathic intracranial hypertension (pseudotumor cerebri).

## 2022-03-15 MED ORDER — GADOBUTROL 1 MMOL/ML IV SOLN
10.0000 mL | Freq: Once | INTRAVENOUS | Status: AC | PRN
  Administered 2022-03-15: 10 mL via INTRAVENOUS

## 2022-03-15 MED ORDER — IOHEXOL 350 MG/ML SOLN
75.0000 mL | Freq: Once | INTRAVENOUS | Status: AC | PRN
  Administered 2022-03-15: 75 mL via INTRAVENOUS

## 2022-03-15 NOTE — ED Notes (Signed)
Pt given sandwich bag with crackers and water.  

## 2022-03-15 NOTE — ED Notes (Signed)
Patient Alert and oriented to baseline. Stable and ambulatory to baseline. Patient verbalized understanding of the discharge instructions.  Patient belongings were taken by the patient.   

## 2022-03-15 NOTE — ED Provider Notes (Signed)
?[facility]ERGENCY DEPT ?[facility] Emergency Department ?Provider Note ?MRN:  [id number]  ?Arrival date & time: 03/15/22    ? ?Chief Complaint   ?Eye Problem ?  ?History of Present Illness   ?John Sims is a 26 y.o. year-old male with no pertinent past medical history presenting to the ED with chief complaint of eye problem. ? ?Patient went to bed yesterday at about 1 AM, woke up the next day with double vision.  Double vision present when both eyes are open.  Constant symptoms.  Associated with headache.  No neck pain, no back pain, no chest pain or shortness of breath, no abdominal pain.  No numbness or weakness to the arms or legs. ? ?Review of Systems  ?A thorough review of systems was obtained and all systems are negative except as noted in the HPI and PMH.  ? ?Patient's Health History   ?History reviewed. No pertinent past medical history.  ?History reviewed. No pertinent surgical history.  ?History reviewed. No pertinent family history.  ?Social History  ? ?Socioeconomic History  ? Marital status: Single  ?  Spouse name: Not on file  ? Number of children: Not on file  ? Years of education: Not on file  ? Highest education level: Not on file  ?Occupational History  ? Not on file  ?Tobacco Use  ? Smoking status: Never  ? Smokeless tobacco: Never  ?Vaping Use  ? Vaping Use: Never used  ?Substance and Sexual Activity  ? Alcohol use: Yes  ?  Comment: occ  ? Drug use: Never  ? Sexual activity: Not on file  ?Other Topics Concern  ? Not on file  ?Social History Narrative  ? Not on file  ? ?Social Determinants of Health  ? ?Financial Resource Strain: Not on file  ?Food Insecurity: Not on file  ?Transportation Needs: Not on file  ?Physical Activity: Not on file  ?Stress: Not on file  ?Social Connections: Not on file  ?Intimate Partner Violence: Not on file  ?  ? ?Physical Exam  ? ?Vitals:  ? 03/15/22 0130 03/15/22 0200  ?BP: 119/87 119/82  ?Pulse: 75 63  ?Resp: 16 12  ?Temp:    ?SpO2: 96% 97%  ?   ?CONSTITUTIONAL: Well-appearing, NAD ?NEURO/PSYCH:  Alert and oriented x 3, normal and symmetric strength and sensation, normal coordination, normal speech ?EYES:  eyes equal and reactive, no nystagmus ?ENT/NECK:  no LAD, no JVD ?CARDIO: Regular rate, well-perfused, normal S1 and S2 ?PULM:  CTAB no wheezing or rhonchi ?GI/GU:  non-distended, non-tender ?MSK/SPINE:  No gross deformities, no edema ?SKIN:  no rash, atraumatic ? ? ?*Additional and/or pertinent findings included in MDM below ? ?Diagnostic and Interventional Summary  ? ? EKG Interpretation ? ?Date/Time:  Sunday March 15 2022 01:11:27 EDT ?Ventricular Rate:  77 ?PR Interval:  127 ?QRS Duration: 113 ?QT Interval:  373 ?QTC Calculation: 423 ?R Axis:   71 ?Text Interpretation: Sinus rhythm Incomplete right bundle branch block Baseline wander in lead(s) V4 Confirmed by Kennis Carina [phone]) on 03/15/2022 3:19:26 AM ?  ? ?  ? ?Labs Reviewed  ?APTT - Abnormal; Notable for the following components:  ?    Result Value  ? aPTT 48 (*)   ? All other components within normal limits  ?URINALYSIS, ROUTINE W REFLEX MICROSCOPIC - Abnormal; Notable for the following components:  ? Specific Gravity, Urine >1.046 (*)   ? All other components within normal limits  ?ETHANOL  ?PROTIME-INR  ?CBC  ?DIFFERENTIAL  ?COMPREHENSIVE METABOLIC PANEL  ?  RAPID URINE DRUG SCREEN, HOSP PERFORMED  ?I-STAT CHEM 8, ED  ?  ?CT ANGIO HEAD NECK W WO CM    (Results Pending)  ?MR Brain W and Wo Contrast    (Results Pending)  ?MR ORBITS W WO CONTRAST    (Results Pending)  ?  ?Medications  ?iohexol (OMNIPAQUE) 350 MG/ML injection 75 mL (75 mLs Intravenous Contrast Given 03/15/22 0134)  ?  ? ?Procedures  /  Critical Care ?Procedures ? ?ED Course and Medical Decision Making  ?Initial Impression and Ddx ?Differential diagnosis includes stroke versus eye pathology.  Awaiting CTA, patient will likely need MRI. ? ?Past medical/surgical history that increases complexity of ED encounter: None ? ?Interpretation  of Diagnostics ?I personally reviewed the EKG and my interpretation is as follows: Sinus rhythm ?   ?Labs do not reveal significant blood count or electrolyte disturbance.  CT imaging is reassuring. ? ?Patient Reassessment and Ultimate Disposition/Management ?Discussed case with neurology, will send to [facility] for MRI imaging.  Accepting ED physician is Dr. Olivia Canter. ? ?Patient management required discussion with the following services or consulting groups:  Nephrology ? ?Complexity of Problems Addressed ?Acute illness or injury that poses threat of life of bodily function ? ?Additional Data Reviewed and Analyzed ?Further history obtained from: ?None ? ?Additional Factors Impacting ED Encounter Risk ?Consideration of hospitalization ? ?Elmer Sow. Pilar Plate, MD ?[facility] Emergency Medicine ?[facility] [email or web address] ? ?Final Clinical Impressions(s) / ED Diagnoses  ? ?  ICD-10-CM   ?1. Double vision  H53.2   ?  ?  ?ED Discharge Orders   ? ? None  ? ?  ?  ? ?Discharge Instructions Discussed with and Provided to Patient:  ? ? ? ?Discharge Instructions   ? ?  ?You are being transferred to [facility] for MRI. ? ? ? ? ?  ?Sabas Sous, MD ?03/15/22 [phone] ? ?

## 2022-03-15 NOTE — ED Provider Notes (Signed)
Pt from [facility] for MRI for new onset binocular diplopia.  MRI pending. ?  ?Tilden Fossa, MD ?03/15/22 [phone] ? ?

## 2022-03-15 NOTE — ED Provider Notes (Signed)
Patient signed out to me as pending neurology consultation.  Case discussed with neurology, recommended continued outpatient follow-up.  Advised return for worsening symptoms new numbness weakness or any additional concerns. ?  ?Luna Fuse, MD ?03/15/22 1730 ? ?

## 2022-03-15 NOTE — ED Provider Notes (Signed)
?  Physical Exam  ?BP 123/77 (BP Location: Right Arm)   Pulse 84   Temp 98.4 ?F (36.9 ?C) (Oral)   Resp 14   Ht 6\' 1"  (1.854 m)   Wt 105.7 kg   SpO2 100%   BMI 30.74 kg/m?  ? ?Physical Exam ? ?Procedures  ?Procedures ? ?ED Course / MDM  ?  ?Medical Decision Making ?Amount and/or Complexity of Data Reviewed ?Labs: ordered. ?Radiology: ordered. ? ?Risk ?Prescription drug management. ? ? ?Pt sent to the ER for binoular diplopia. ?He is awaiting MRI to r/o MS, ischemia. ? ? ? ? ? ? ?  ? , MD ?03/15/22 [phone] ? ?

## 2022-03-15 NOTE — ED Notes (Signed)
Pt ambulated to the bathroom.  

## 2022-03-15 NOTE — Discharge Instructions (Addendum)
Follow-up with outpatient neurology in 1 week.. ? ?Return to the ER if your symptoms worsen if you have fever cough vomiting or any additional concerns. ?

## 2022-03-15 NOTE — ED Notes (Signed)
Pt pov with mother to [facility] ed. Reported called to [place]. Edp aware. Iv removed. Pt alert and oriented.  ?

## 2022-03-15 NOTE — Consult Note (Signed)
Neurology Attending Attestation ?  ?I examined the patient and discussed plan with Dr. Carlyn Reichert resident. I agree with the findings in note below. Patient presented with mild binocular diplopia, now nearly resolved. He has had this once before that resolved without intervention. He has a complete normal neurologic examination now, no pain with eye movement, v mild pressure around L eye only which has nearly subsided. MRI brain and orbits with and without contrast personally reviewed and were normal except from incidental empty sella. CTA H&N WNL. No ptosis, fatigable upgaze, or sx c/f myasthenia. Given normal neurologic examination, absence of red flags by hx, and normal brain, orbits, and cerebrovascular imaging, no further inpatient neurologic workup indicated at this time. I will arrange outpatient f/u locally for patient. He was given instructions that if his sx recur/worsen or he develops new focal neurologic deficits he should seek care immediately in his local emergency room. ?  ?Bing Neighbors, MD ?[facility] ?[phone] ?  ?If 7pm- 7am, please page neurology on call as listed in AMION. ? ? ?NEUROLOGY CONSULTATION NOTE  ? ?Date of service: March 15, 2022 ?Patient Name: John Sims ?MRN:  [id number] ?DOB:  1996/05/26 ?Reason for consult: "double vision" ?Requesting Provider: Cheryll Cockayne, MD ? ?History of Present Illness  ?John Sims is a 26 y.o. male with no known major past medical history.  He presented to the emergency department today for evaluation of diplopia. ? ?The patient reports that he woke up yesterday morning with double vision.  He reports going into work and subsequently leaving because he was unable to complete any of his tasks due to his vision.  At the time of this assessment, the patient reports that his double vision has improved, which he feels that is due to resting and sleep.  He reports that he has had episodes similar to this previously.  He states they  resolved spontaneously without any intervention. ? ?He denies experiencing any pain with eye movement.  He states that he is experiencing slight pressure in the infraorbital region, which has also improved with time.  The patient reports that he wears glasses normally and states that he has not had his prescription changed for approximately 10 years. ?ROS  ? ?Constitutional Denies weight loss, fever and chills.   ?HEENT Denies changes in vision and hearing.   ?Respiratory Denies SOB and cough.   ?CV Denies palpitations and CP   ?GI Denies abdominal pain, nausea, vomiting and diarrhea.   ?GU Denies dysuria and urinary frequency.   ?MSK Denies myalgia and joint pain.   ?Skin Denies rash and pruritus.   ?Neurological Denies headache and syncope.   ?Psychiatric Reports recent stress, denies depression   ? ?Past History  ?History reviewed. No pertinent past medical history. ?History reviewed. No pertinent surgical history. ?History reviewed. No pertinent family history. ?Social History  ? ?Socioeconomic History  ? Marital status: Single  ?  Spouse name: Not on file  ? Number of children: Not on file  ? Years of education: Not on file  ? Highest education level: Not on file  ?Occupational History  ? Not on file  ?Tobacco Use  ? Smoking status: Never  ? Smokeless tobacco: Never  ?Vaping Use  ? Vaping Use: Never used  ?Substance and Sexual Activity  ? Alcohol use: Yes  ?  Comment: occ  ? Drug use: Never  ? Sexual activity: Not on file  ?Other Topics Concern  ? Not on file  ?Social History Narrative  ?  Not on file  ? ?Social Determinants of Health  ? ?Financial Resource Strain: Not on file  ?Food Insecurity: Not on file  ?Transportation Needs: Not on file  ?Physical Activity: Not on file  ?Stress: Not on file  ?Social Connections: Not on file  ? ?No Known Allergies ? ?Medications  ?(Not in a hospital admission) ?  ? ?Vitals  ? ?Vitals:  ? 03/15/22 1136 03/15/22 1300 03/15/22 1304 03/15/22 1430  ?BP: 125/89 122/73 122/73  130/83  ?Pulse: 80 69 69 79  ?Resp: 16  16 16   ?Temp:      ?TempSrc:      ?SpO2: 100% 94% 94% 95%  ?Weight:      ?Height:      ?  ? ?Body mass index is 30.74 kg/m?. ? ?Physical Exam  ?General: Lying comfortably in bed; in no acute distress.  ?HENT: Normal oropharynx and mucosa. Normal external appearance of ears and nose.  ?Neck: Supple, no pain or tenderness  ?CV: No peripheral edema.  ?Pulmonary: Symmetric Chest rise. Normal respiratory effort.  ?Ext: No cyanosis, edema, or deformity  ?Skin: No rash. Normal palpation of skin.   ?  ?  ?Neurologic Examination  ?Mental status/Cognition: Alert, oriented to self, place, month and year, good attention.  ?Speech/language: Fluent, comprehension intact. ?Vision: ?Appears to have a slight L eye exotropia. Patient reports seeing double in the horizontal direction with both eyes open. Diplopia is extinguished with covering either eye. Visual fields are full and pupils are equal, round, and reactive. No APD. EOM intact. No nystagmus noted.  ? ?Cranial nerves:  ? CN II See above  ? CN III,IV,VI See above  ? CN V normal sensation in V1, V2, and V3 segments bilaterally   ? CN VII no asymmetry, no nasolabial fold flattening   ? CN VIII normal hearing to speech   ? CN IX & X normal palatal elevation, no uvular deviation   ? CN XI 5/5 head turn and 5/5 shoulder shrug bilaterally   ? CN XII midline tongue protrusion   ?  ?Motor:  ?Mvmt Root Nerve  Muscle Right Left Comments  ?SA C5/6 Ax Deltoid 5/5 5/5    ?EF C5/6 Mc Biceps 5/5 5/5    ?EE C6/7/8 Rad Triceps 5/5 5/5    ?WF C6/7 Med FCR        ?WE C7/8 PIN ECU        ?F Ab C8/T1 U ADM/FDI        ?HF L1/2/3 Fem Illopsoas 5/5 5/5    ?KE L2/3/4 Fem Quad  5/5 5/5    ?DF L4/5 D Peron Tib Ant        ?PF S1/2 Tibial Grc/Sol        ?  ?Reflexes: ?  Right Left Comments  ?Pectoralis        ? Biceps (C5/6) 2+ 2+    ?Brachioradialis (C5/6)      ? Triceps (C6/7)      ? Patellar (L3/4) 2+ 2+    ? Achilles (S1)      ? Hoffman        ? Plantar         ?Jaw jerk     ?  ?Sensation: ? Light touch intact  ? Pin prick    ? Temperature  intact  ? Vibration    ?Proprioception    ?  ?Coordination/Complex Motor:  ?- Finger to Nose intact ?- Heel to shin intact ?- Rapid alternating movement intact ?-  Gait: deferred ? ? ?Labs  ? ?CBC:  ?Recent Labs  ?Lab 03/15/22 ?0108 03/15/22 ?0114  ?WBC 9.0  --   ?NEUTROABS 4.9  --   ?HGB 15.3 15.3  ?HCT 47.1 45.0  ?MCV 88.9  --   ?PLT 250  --   ? ? ?Basic Metabolic Panel:  ?Lab Results  ?Component Value Date  ? NA 138 03/15/2022  ? K 3.9 03/15/2022  ? CO2 26 03/15/2022  ? GLUCOSE 93 03/15/2022  ? BUN 14 03/15/2022  ? CREATININE 1.00 03/15/2022  ? CALCIUM 9.3 03/15/2022  ? GFRNONAA >60 03/15/2022  ? ?Lipid Panel: No results found for: LDLCALC ?HgbA1c: No results found for: HGBA1C ?Urine Drug Screen:  ?   ?Component Value Date/Time  ? LABOPIA NONE DETECTED 03/15/2022 0224  ? COCAINSCRNUR NONE DETECTED 03/15/2022 0224  ? LABBENZ NONE DETECTED 03/15/2022 0224  ? AMPHETMU NONE DETECTED 03/15/2022 0224  ? THCU NONE DETECTED 03/15/2022 0224  ? LABBARB NONE DETECTED 03/15/2022 0224  ?  ?Alcohol Level  ?   ?Component Value Date/Time  ? ETH <10 03/15/2022 0108  ? ? ?MRI-Brain and orbits with and without contrast: ?IMPRESSION: ?1. Disconjugate gaze, but otherwise normal MRI appearance of the ?Orbits. ?2. Normal MRI appearance of the brain aside from partially empty ?sella, often a normal anatomic variant but can be associated with ?idiopathic intracranial hypertension (pseudotumor cerebri). ? ?CTA-Head and Neck: ?IMPRESSION: ?1. Normal CTA of the head and neck. No large vessel occlusion, ?hemodynamically significant stenosis, or other acute vascular ?abnormality. ?2. No other acute intracranial abnormality. ?3. Empty sella. ? ? ?Impression  ? ?John Sims is a 26 y.o. male with no known major past medical history.  He presented to the emergency department today for evaluation of diplopia. ? ?Stroke and MS ruled out with negative MRI of the  brain.  Myasthenia gravis is unlikely given lack of associated ptosis or bulbar symptoms and improvement as the day progresses.  IIH is unlikely given lack of severe headache, which has an excellent sensiti

## 2022-03-16 ENCOUNTER — Telehealth: Payer: Self-pay | Admitting: *Deleted

## 2022-03-16 NOTE — Telephone Encounter (Signed)
Pt mom called regarding additional information/discharge instructions.  RNCM returned call...no  answer. ?

## 2022-03-18 ENCOUNTER — Encounter: Payer: Self-pay | Admitting: Neurology

## 2022-03-18 ENCOUNTER — Telehealth: Payer: Self-pay | Admitting: Neurology

## 2022-03-18 ENCOUNTER — Ambulatory Visit (INDEPENDENT_AMBULATORY_CARE_PROVIDER_SITE_OTHER): Payer: Self-pay | Admitting: Neurology

## 2022-03-18 ENCOUNTER — Other Ambulatory Visit: Payer: Self-pay | Admitting: *Deleted

## 2022-03-18 ENCOUNTER — Other Ambulatory Visit: Payer: Self-pay | Admitting: Neurology

## 2022-03-18 VITALS — BP 120/80 | HR 76 | Ht 73.0 in | Wt 228.5 lb

## 2022-03-18 DIAGNOSIS — H532 Diplopia: Secondary | ICD-10-CM

## 2022-03-18 DIAGNOSIS — G7000 Myasthenia gravis without (acute) exacerbation: Secondary | ICD-10-CM

## 2022-03-18 MED ORDER — PYRIDOSTIGMINE BROMIDE 60 MG PO TABS: 60.0000 mg | ORAL_TABLET | Freq: Three times a day (TID) | ORAL | 6 refills | Status: AC

## 2022-03-18 NOTE — Telephone Encounter (Signed)
Mesgh sent ?

## 2022-03-18 NOTE — Progress Notes (Signed)
? ?Chief Complaint  ?Patient presents with  ? New Patient (Initial Visit)  ?  Rm 13. Alone. No PCP. ?NP/ED referral for double vision.  ? ? ? ? ?ASSESSMENT AND PLAN ? ?John Sims is a 26 y.o. male   ?Binocular diplopia ? There is mild fatigable ptosis, mild proximal upper extremity muscle weakness, cover and uncover testing also demonstrated bilateral exophoria ? Most suggestive of myasthenia gravis ? Laboratory evaluation including thyroid functional test, acetylcholine receptor antibody ? Mestinon 60 mg 3 times daily ? CT chest ? Return to clinic in 10 to 14 days, call clinic or go to urgent care for worsening muscle weakness ? ? ?DIAGNOSTIC DATA (LABS, IMAGING, TESTING) ?- I reviewed patient records, labs, notes, testing and imaging myself where available. ? ? ?MEDICAL HISTORY: ? ?John Sims, is a 26 year old male seen in request by emergency room for double vision, initial evaluation was on March 18, 2022 ? ?I reviewed and summarized the referring note. ? ?He was previously healthy, works as a Building control surveyor, she was doing very well, was able to fix his car alone, including lifting heavy transmission on March 13, 2022 ? ?After long days of work, he felt tired, next day March 14, 2022, when he woke up he noticed double vision, the relationship of 2 imagings changes, sometimes vertical, oblique, will horizontal, he was able to identify it was binocular double vision, seems to improve tilting his head to left shoulder ? ?The double vision has been persistent since then, he denies swallowing difficulty, no droopy eyelid, no limb muscle weakness ? ?He presented to local urgent care, later referred to [facility], ? ?I personally reviewed MRI of the brain, orbits with without contrast, no significant abnormality ? ?CT angiogram of head and neck showed no large vessel disease ? ?Laboratory evaluation showed normal CBC, CMP, negative alcohol level ? ?PHYSICAL EXAM: ?  ?Vitals:  ? 03/18/22 0847  ?BP:  120/80  ?Pulse: 76  ?Weight: 228 lb 8 oz (103.6 kg)  ?Height: 6\' 1"  (1.854 m)  ? ?Not recorded ?  ? ? ?Body mass index is 30.15 kg/m?. ? ?PHYSICAL EXAMNIATION: ? ?Gen: NAD, conversant, well nourised, well groomed                     ?Cardiovascular: Regular rate rhythm, no peripheral edema, warm, nontender. ?Eyes: Conjunctivae clear without exudates or hemorrhage ?Neck: Supple, no carotid bruits. ?Pulmonary: Clear to auscultation bilaterally  ? ?NEUROLOGICAL EXAM: ? ?MENTAL STATUS: ?Speech: ?   Speech is normal; fluent and spontaneous with normal comprehension.  ?Cognition: ?    Orientation to time, place and person ?    Normal recent and remote memory ?    Normal Attention span and concentration ?    Normal Language, naming, repeating,spontaneous speech ?    Fund of knowledge ?  ?CRANIAL NERVES: ?CN II: Visual fields are full to confrontation. Pupils are round equal and briskly reactive to light. ?CN III, IV, VI: extraocular movement are normal.  Mild fatigable bilateral ptosis, cover upper edge of pupil, red lens testing and covering uncover test showed bilateral exophoria ?CN V: Facial sensation is intact to light touch ?CN VII: Face is symmetric with normal eye closure  ?CN VIII: Hearing is normal to causal conversation. ?CN IX, X: Phonation is normal. ?CN XI: Head turning and shoulder shrug are intact ? ?MOTOR: Mild bilateral upper extremity proximal muscle weakness, rest of the muscle group was normal ? ?REFLEXES: ?Reflexes are 1 and  symmetric at the biceps, triceps, knees, and ankles. Plantar responses are flexor. ? ?SENSORY: ?Intact to light touch, pinprick and vibratory sensation are intact in fingers and toes. ? ?COORDINATION: ?There is no trunk or limb dysmetria noted. ? ?GAIT/STANCE: ?Posture is normal. Gait is steady with normal steps, base, arm swing, and turning. Heel and toe walking are normal. Tandem gait is normal.  ?Romberg is absent. ? ?REVIEW OF SYSTEMS:  ?Full 14 system review of systems  performed and notable only for as above ?All other review of systems were negative. ? ? ?ALLERGIES: ?No Known Allergies ? ?HOME MEDICATIONS: ?No current outpatient medications on file.  ? ?No current facility-administered medications for this visit.  ? ? ?PAST MEDICAL HISTORY: ?History reviewed. No pertinent past medical history. ? ?PAST SURGICAL HISTORY: ?History reviewed. No pertinent surgical history. ? ?FAMILY HISTORY: ?History reviewed. No pertinent family history. ? ?SOCIAL HISTORY: ?Social History  ? ?Socioeconomic History  ? Marital status: Single  ?  Spouse name: Not on file  ? Number of children: Not on file  ? Years of education: Not on file  ? Highest education level: Not on file  ?Occupational History  ? Not on file  ?Tobacco Use  ? Smoking status: Never  ? Smokeless tobacco: Never  ?Vaping Use  ? Vaping Use: Never used  ?Substance and Sexual Activity  ? Alcohol use: Yes  ?  Comment: occ  ? Drug use: Never  ? Sexual activity: Not on file  ?Other Topics Concern  ? Not on file  ?Social History Narrative  ? Not on file  ? ?Social Determinants of Health  ? ?Financial Resource Strain: Not on file  ?Food Insecurity: Not on file  ?Transportation Needs: Not on file  ?Physical Activity: Not on file  ?Stress: Not on file  ?Social Connections: Not on file  ?Intimate Partner Violence: Not on file  ? ? ? ? ?Levert Feinstein, M.D. Ph.D. ? ?[facility] ?[address], [address] ?[place], [place] [address] ?Ph: [phone]) [phone] ?Fax: [phone] ? ?CC:  No referring provider defined for this encounter.  Pcp, No   ?

## 2022-03-19 ENCOUNTER — Encounter: Payer: Self-pay | Admitting: *Deleted

## 2022-03-19 ENCOUNTER — Telehealth: Payer: Self-pay | Admitting: Neurology

## 2022-03-19 LAB — THYROID PANEL WITH TSH
Free Thyroxine Index: 1.9 (ref 1.2–4.9)
T3 Uptake Ratio: 27 % (ref 24–39)
T4, Total: 7.2 ug/dL (ref 4.5–12.0)
TSH: 4.41 u[IU]/mL (ref 0.450–4.500)

## 2022-03-19 LAB — SEDIMENTATION RATE: Sed Rate: 2 mm/hr (ref 0–15)

## 2022-03-19 LAB — ANA W/REFLEX IF POSITIVE: Anti Nuclear Antibody (ANA): NEGATIVE

## 2022-03-19 LAB — C-REACTIVE PROTEIN: CRP: 5 mg/L (ref 0–10)

## 2022-03-19 LAB — CK: Total CK: 109 U/L (ref 49–439)

## 2022-03-19 NOTE — Telephone Encounter (Signed)
I spoke to the patient and his mother. He started the Mestinon 60mg , one tab TID on 03/18/22. He has also taken two doses today as well. He has not had any improvement in his vision. Denies any adverse side effects or tolerability issues with the medication. ? ?Dr. Krista Blue is out of the office. I spoke to Dr. Jaynee Eagles (work-in MD). Per vo, instructed the patient to increase the Mestinon 60mg  to two tablet TID (120mg  TID). If he develops diarrhea, he can back off to 1.5 tabs TID (90mg  TID). He will keep [place] update on his symptoms and call our office with any concerns. ? ?He verbalized understanding go to ED or urgent care for worsening muscle weakness, as discussed by Dr. Krista Blue at this last visit. ? ?His CT chest w/ contrast has been moved to an earlier date (03/26/22). He will keep his follow up w/ Dr. Krista Blue on 03/31/22. ? ?Letter for work will be sent through [employer].  ?

## 2022-03-19 NOTE — Telephone Encounter (Signed)
self pay-but apply for [facility] assistance order sent to [facility] to be scheduled at [facility] ?

## 2022-03-19 NOTE — Telephone Encounter (Signed)
Pt believes thepyridostigmine (MESTINON) 60 MG tablet, is the cause of his double vision and headaches.  Pt is asking for a cal to discuss.  Pt is also asking if Dr Terrace Arabia would be willing to write him out of work for a couple of days due to the double vision that he is still having. ?

## 2022-03-25 ENCOUNTER — Telehealth: Payer: Self-pay | Admitting: Neurology

## 2022-03-25 NOTE — Telephone Encounter (Signed)
Pt said having reaction to pyridostigmine (MESTINON) 60 MG tablet. Tongue get feels like pins and needles, spasm in the eye and random across the body. Would like a call from the nurse. ?

## 2022-03-25 NOTE — Telephone Encounter (Signed)
I called the patient back. He tried taking Mestinon 60mg , two tablets TID (as directed in previously phone note). He was unable to tolerate this dosage. Reports pin/needle sensation in tongue, intermittent spasms. Once he reduced the medication down to 1.5 tabs TID, symptoms improved. At this dose, he has noticed about a 65% improvement in vision. He has continued wearing an eye patch, periodically switching sides. He will move forward with his CT chest 4/13/ and follow up 4/18. ?

## 2022-03-26 ENCOUNTER — Ambulatory Visit (HOSPITAL_COMMUNITY)
Admission: RE | Admit: 2022-03-26 | Discharge: 2022-03-26 | Disposition: A | Discharge status: Home / Self Care | Payer: Self-pay | Source: Ambulatory Visit | Attending: Neurology | Admitting: Neurology

## 2022-03-26 DIAGNOSIS — G7000 Myasthenia gravis without (acute) exacerbation: Secondary | ICD-10-CM | POA: Insufficient documentation

## 2022-03-26 DIAGNOSIS — H532 Diplopia: Secondary | ICD-10-CM | POA: Insufficient documentation

## 2022-03-26 IMAGING — CT CT CHEST W/ CM
2 of 4 series · 15 of 36 positions shown, 18 images · IV contrast (APPLIED)
Comparison: None.

CLINICAL DATA: Myasthenia gravis, diplopia

EXAM:
CT CHEST WITH CONTRAST
TECHNIQUE: Multidetector CT imaging of the chest was performed during
intravenous contrast administration.

[Series 3: chest w · axial · 0.80mm/px · z∈[-346,-40]mm · 12 of 181 slices shown, 15 images]
[im 14/181  mediastinal]
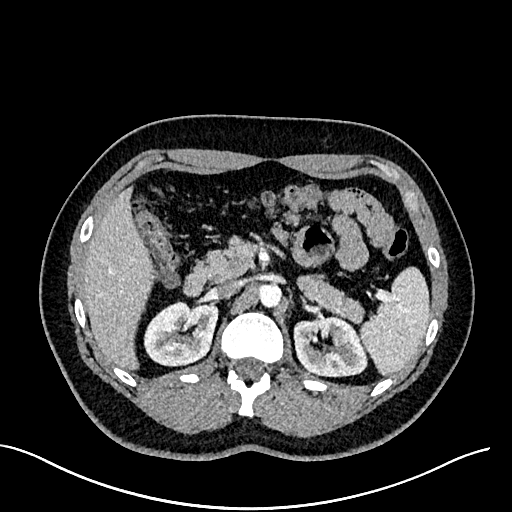
[im 14/181  lung]
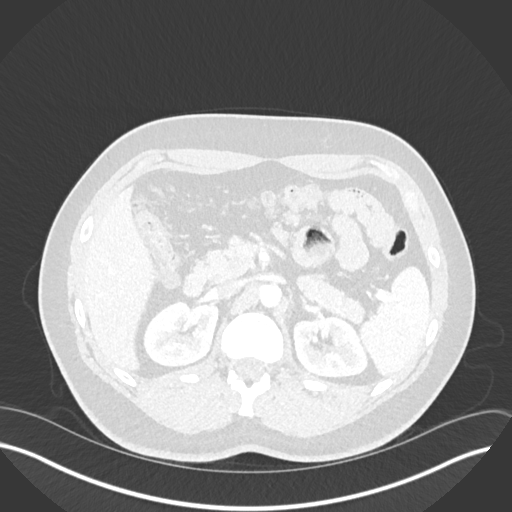
[im 28/181  lung]
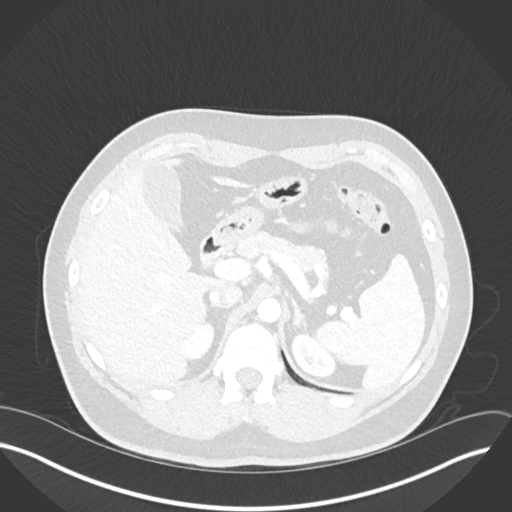
[im 42/181  lung]
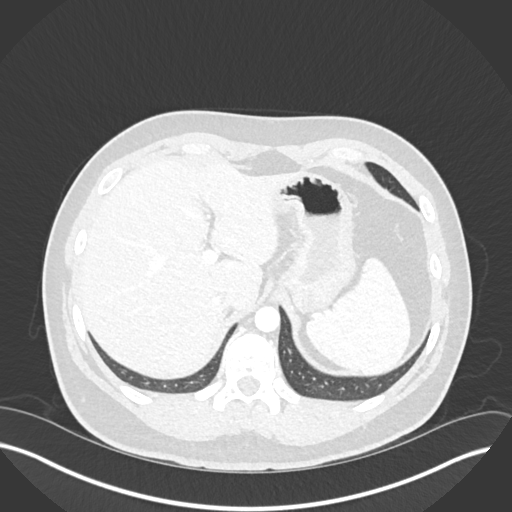
[im 56/181  lung]
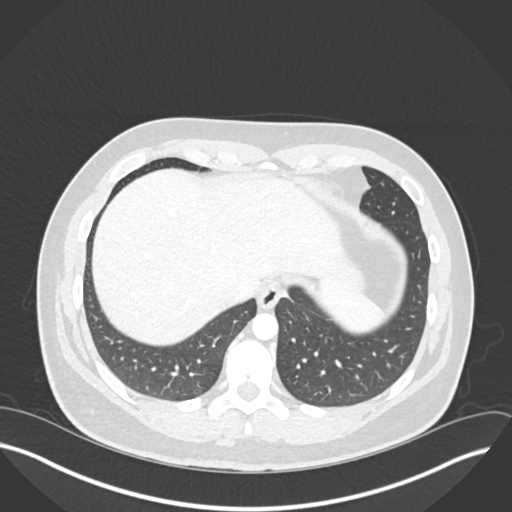
[im 70/181  mediastinal]
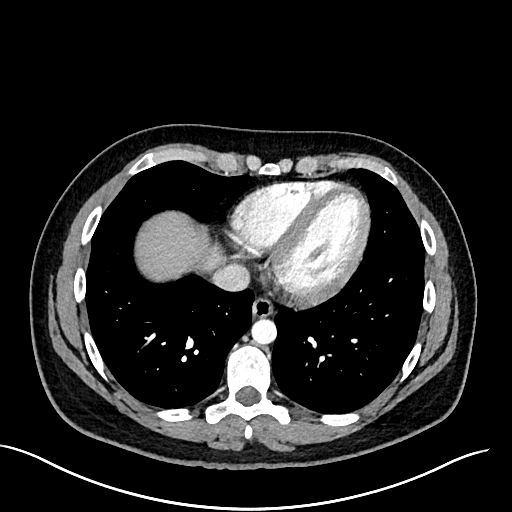
[im 70/181  lung]
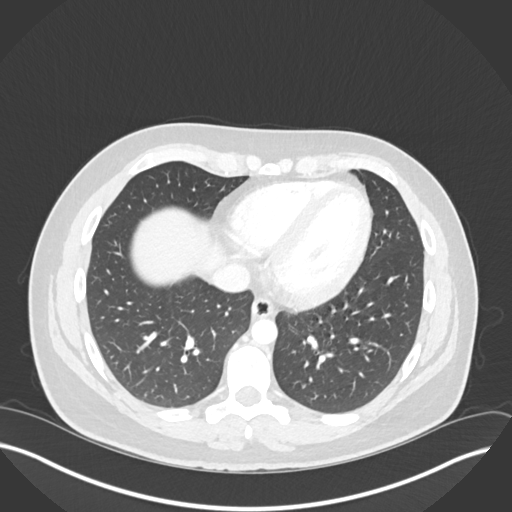
[im 84/181  lung]
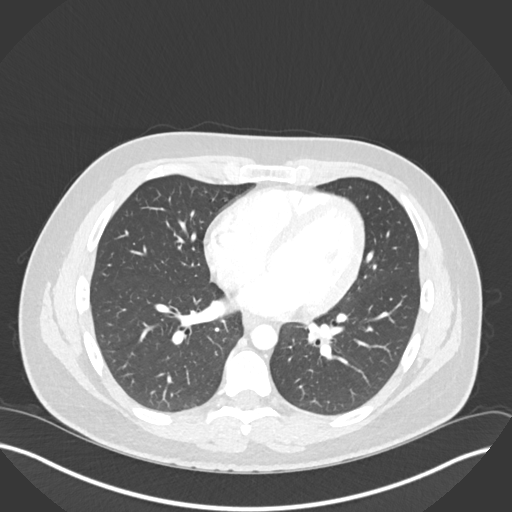
[im 97/181  lung]
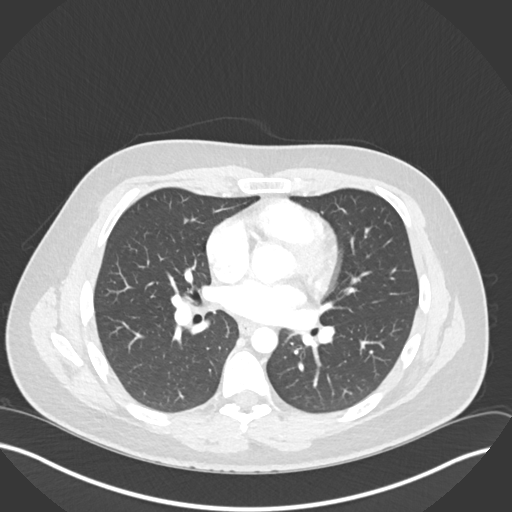
[im 111/181  lung]
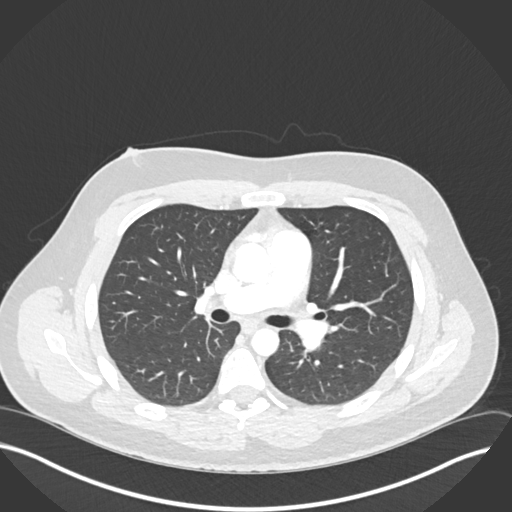
[im 125/181  mediastinal]
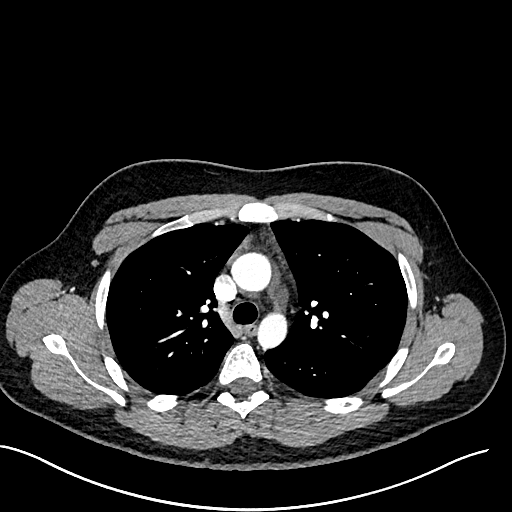
[im 125/181  lung]
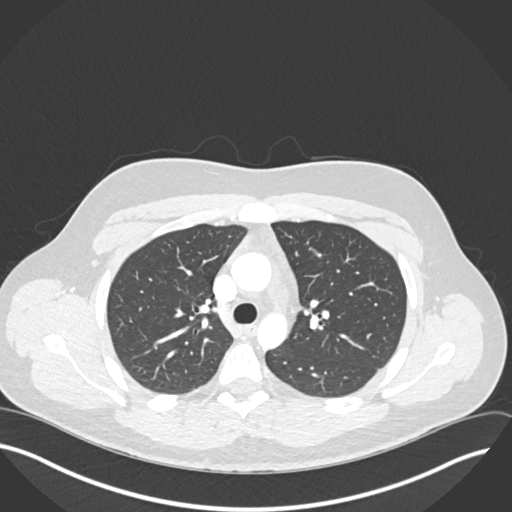
[im 139/181  lung]
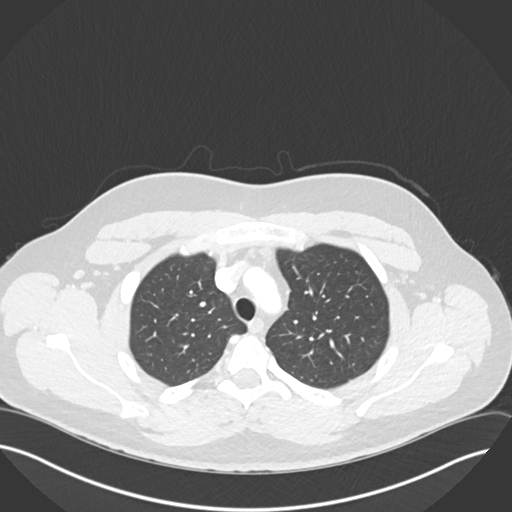
[im 153/181  lung]
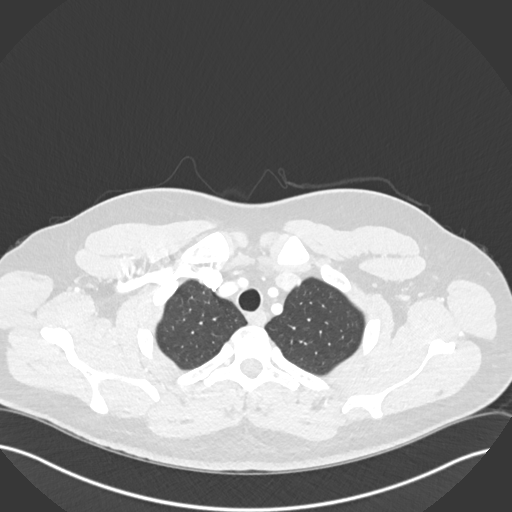
[im 167/181  lung]
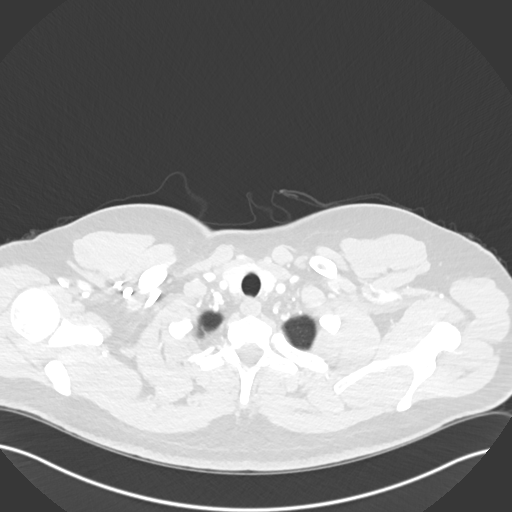

[Series 6: cor · coronal · 0.71mm/px · 3 of 149 slices shown]
[im 30/149  lung]
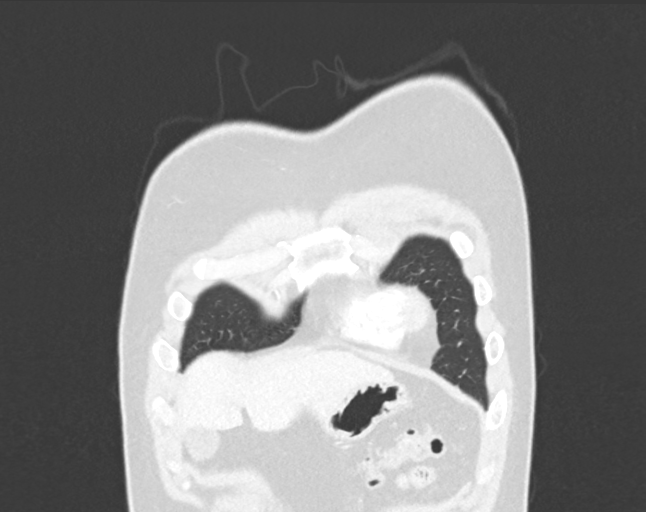
[im 60/149  lung]
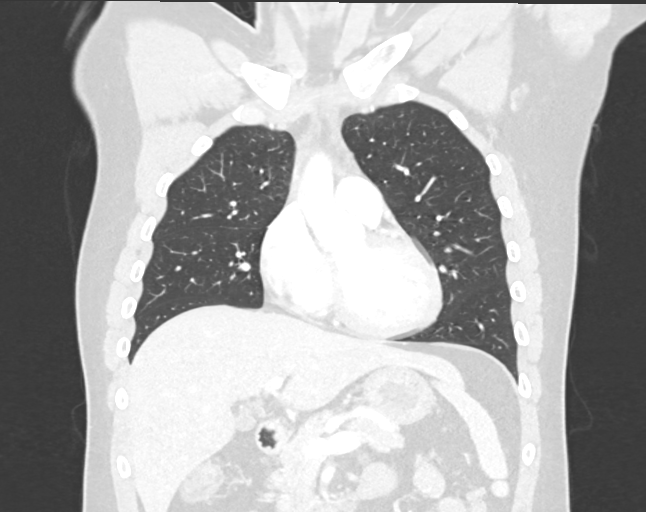
[im 89/149  lung]
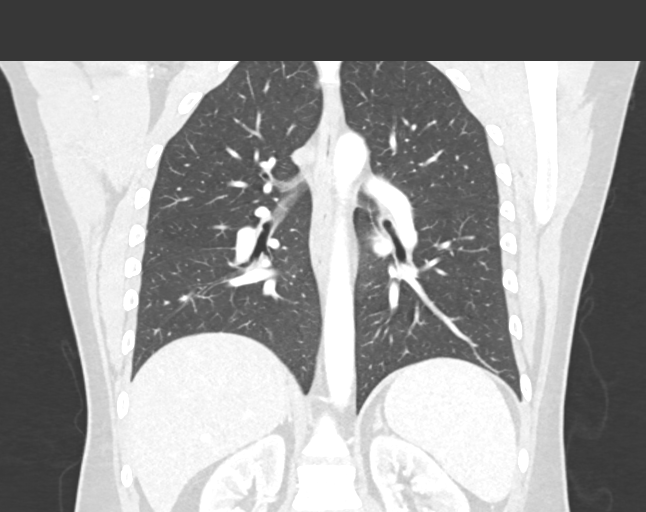

[15 of 36 positions shown; findings below may reference images not displayed]

RADIATION DOSE REDUCTION: This exam was performed according to the
departmental dose-optimization program which includes automated
exposure control, adjustment of the mA and/or kV according to
patient size and/or use of iterative reconstruction technique.

CONTRAST:  75mL OMNIPAQUE IOHEXOL 300 MG/ML  SOLN
FINDINGS: Cardiovascular: There is homogeneous enhancement in thoracic aorta.
There are no intraluminal filling defects in the central pulmonary
artery branches.

Mediastinum/Nodes: No significant lymphadenopathy seen. There is fat
attenuation in the anterior mediastinum. Density measurements show
fat attenuation. Findings most likely suggest normal regression of
thymus. There is no definite signs of thymic hyperplasia or thymoma.

Lungs/Pleura: There is no focal pulmonary consolidation. There are
no discrete lung nodules. There is no pleural effusion or
pneumothorax.

Upper Abdomen: There is fatty infiltration in the liver.

Musculoskeletal: Unremarkable.
IMPRESSION: There is fat attenuation in the anterior mediastinum suggesting
normal regression of thymus. There are no imaging signs of thymic
hyperplasia or thymoma.

There is no significant lymphadenopathy. There are no focal
infiltrates or nodules in the lung fields.

Fatty liver.

## 2022-03-26 MED ORDER — IOHEXOL 300 MG/ML  SOLN
75.0000 mL | Freq: Once | INTRAMUSCULAR | Status: AC | PRN
  Administered 2022-03-26: 75 mL via INTRAVENOUS

## 2022-03-31 ENCOUNTER — Encounter: Payer: Self-pay | Admitting: Neurology

## 2022-03-31 ENCOUNTER — Ambulatory Visit (INDEPENDENT_AMBULATORY_CARE_PROVIDER_SITE_OTHER): Payer: Self-pay | Admitting: Neurology

## 2022-03-31 ENCOUNTER — Other Ambulatory Visit (HOSPITAL_COMMUNITY): Payer: Self-pay

## 2022-03-31 VITALS — BP 131/79 | HR 72 | Ht 73.0 in | Wt 235.5 lb

## 2022-03-31 DIAGNOSIS — H532 Diplopia: Secondary | ICD-10-CM

## 2022-03-31 LAB — MYASTHENIA GRAVIS PROFILE
AChR Binding Ab, Serum: <0.03 nmol/L (ref 0.00–0.24)
AChR-modulating Ab: 0 % (ref 0–45)
Acetylchol Block Ab: 23 % (ref 0–25)
Anti-striation Abs: NEGATIVE

## 2022-03-31 LAB — MUSK ANTIBODIES: MuSK Antibodies: <1 U/mL

## 2022-03-31 NOTE — Patient Instructions (Signed)
Single fiber EMG

## 2022-03-31 NOTE — Progress Notes (Signed)
? ?Chief Complaint  ?Patient presents with  ? Follow-up  ?  Rm 13. ?F/u for double vision. States vision is better this morning. C/o vision problems when tilting head to the left.  ? ? ? ? ?ASSESSMENT AND PLAN ? ?John Sims is a 26 y.o. male   ?Binocular diplopia ? over and uncover testing also demonstrated bilateral exophoria,  mild proximal upper extremity muscle weakness,  ? Most suggestive of myasthenia gravis ? Acetylcholine receptor antibodies were negative, musk antibody is pending ? Mestinon 60 mg 3 times daily as needed, could not tolerate higher dose due to GI side effect ? CT chest showed no significant thymus pathology ? If MuSK antibody is negative, will refer him to [facility] for single-fiber EMG ? ? ?DIAGNOSTIC DATA (LABS, IMAGING, TESTING) ?- I reviewed patient records, labs, notes, testing and imaging myself where available. ? ? ?MEDICAL HISTORY: ? ?John Sims, is a 26 year old male seen in request by emergency room for double vision, initial evaluation was on March 18, 2022 ? ?I reviewed and summarized the referring note. ? ?He was previously healthy, works as a Scientist, water quality, she was doing very well, was able to fix his car alone, including lifting heavy transmission on March 13, 2022 ? ?After long days of work, he felt tired, next day March 14, 2022, when he woke up he noticed double vision, the relationship of 2 imagings changes, sometimes vertical, oblique, will horizontal, he was able to identify it was binocular double vision, seems to improve tilting his head to left shoulder ? ?The double vision has been persistent since then, he denies swallowing difficulty, no droopy eyelid, no limb muscle weakness ? ?He presented to local urgent care, later referred to [facility], ? ?I personally reviewed MRI of the brain, orbits with without contrast, no significant abnormality ? ?CT angiogram of head and neck showed no large vessel disease ? ?Laboratory  evaluation showed normal CBC, CMP, negative alcohol level ? ?UPDATE March 31 2022: ?He is taking Mestinon as needed,  for a while he tried up to 2 tablets 3 times a day, complains of intolerable GI side effect, he is now only taking Mestinon 60 mg 3 times daily, today's examination was performed without Mestinon, there is no significant diplopia, but has noticeable bilateral exophoria, with mild bilateral proximal upper extremity weakness ? ?CT chest showed no significant thymus pathology ?Laboratory evaluation showed normal TSH, negative acetylcholine receptor antibody, musk antibody is pending, rest of the laboratory evaluation showed normal or negative ESR, C-reactive protein, CPK, UDS, CMP CBC ? ? ?PHYSICAL EXAM: ?  ?Vitals:  ? 03/31/22 0916  ?BP: 131/79  ?Pulse: 72  ?Weight: 235 lb 8 oz (106.8 kg)  ?Height: _0  (1.854 m)  ? ?Not recorded ?  ? ? ?Body mass index is 31.07 kg/m?. ? ?PHYSICAL EXAMNIATION: ? ?Gen: NAD, conversant, well nourised, well groomed                     ?Cardiovascular: Regular rate rhythm, no peripheral edema, warm, nontender. ?Eyes: Conjunctivae clear without exudates or hemorrhage ?Neck: Supple, no carotid bruits. ?Pulmonary: Clear to auscultation bilaterally  ? ?NEUROLOGICAL EXAM: ? ?MENTAL STATUS: ?Speech/cognition: ?Awake, alert, oriented to history taking and care conversation, ?CRANIAL NERVES: ?CN II: Visual fields are full to confrontation. Pupils are round equal and briskly reactive to light. ?CN III, IV, VI: extraocular movement are normal.  No ptosis, cover upper edge of pupil, red lens testing and covering  uncover test showed obvious bilateral exophoria ?CN V: Facial sensation is intact to light touch ?CN VII: Face is symmetric with normal eye closure  ?CN VIII: Hearing is normal to causal conversation. ?CN IX, X: Phonation is normal. ?CN XI: Head turning and shoulder shrug are intact ? ?MOTOR: Mild bilateral upper extremity proximal muscle weakness, rest of the muscle group  was normal ? ?REFLEXES: ?Reflexes are 1 and symmetric at the biceps, triceps, knees, and ankles. Plantar responses are flexor. ? ?SENSORY: ?Intact to light touch, pinprick and vibratory sensation are intact in fingers and toes. ? ?COORDINATION: ?There is no trunk or limb dysmetria noted. ? ?GAIT/STANCE: ?Posture is normal. Gait is steady with normal steps, base, arm swing, and turning. Heel and toe walking are normal. Tandem gait is normal.  ?Romberg is absent. ? ?REVIEW OF SYSTEMS:  ?Full 14 system review of systems performed and notable only for as above ?All other review of systems were negative. ? ? ?ALLERGIES: ?No Known Allergies ? ?HOME MEDICATIONS: ?Current Outpatient Medications  ?Medication Sig Dispense Refill  ? pyridostigmine (MESTINON) 60 MG tablet Take 1 tablet (60 mg total) by mouth 3 (three) times daily. (Patient taking differently: Take 120 mg by mouth 3 (three) times daily.) 90 tablet 6  ? ?No current facility-administered medications for this visit.  ? ? ?PAST MEDICAL HISTORY: ?History reviewed. No pertinent past medical history. ? ?PAST SURGICAL HISTORY: ?History reviewed. No pertinent surgical history. ? ?FAMILY HISTORY: ?History reviewed. No pertinent family history. ? ?SOCIAL HISTORY: ?Social History  ? ?Socioeconomic History  ? Marital status: Single  ?  Spouse name: Not on file  ? Number of children: Not on file  ? Years of education: Not on file  ? Highest education level: Not on file  ?Occupational History  ? Not on file  ?Tobacco Use  ? Smoking status: Never  ? Smokeless tobacco: Never  ?Vaping Use  ? Vaping Use: Never used  ?Substance and Sexual Activity  ? Alcohol use: Yes  ?  Comment: occ  ? Drug use: Never  ? Sexual activity: Not on file  ?Other Topics Concern  ? Not on file  ?Social History Narrative  ? Not on file  ? ?Social Determinants of Health  ? ?Financial Resource Strain: Not on file  ?Food Insecurity: Not on file  ?Transportation Needs: Not on file  ?Physical Activity: Not on  file  ?Stress: Not on file  ?Social Connections: Not on file  ?Intimate Partner Violence: Not on file  ? ? ? ? ?Marcial Pacas, M.D. Ph.D. ? ?[facility] ?[address], [address] ?[place], [place] [address] ?Ph: [phone]) [phone] ?Fax: [phone] ? ?CC:  No referring provider defined for this encounter.  Pcp, No   ?

## 2022-04-01 ENCOUNTER — Telehealth: Payer: Self-pay | Admitting: Neurology

## 2022-04-01 ENCOUNTER — Ambulatory Visit: Payer: Self-pay | Admitting: Neurology

## 2022-04-01 DIAGNOSIS — H532 Diplopia: Secondary | ICD-10-CM

## 2022-04-01 NOTE — Telephone Encounter (Signed)
Please call patient, acetylcholine receptor antibody, MuSK antibody were all negative, ? ?But based on his history, still highly suspicious for neuromuscular junctional disorder, will refer him to [facility] for single-fiber EMG as discussed during last office visit. ? ?Orders Placed This Encounter  ?Procedures  ? Ambulatory referral to Neurology  ? ?   ?Single fiber EMG and neuromuscular consultation  ?Yetta Numbers, MD ?Address: [address], [place], [place] [address] ?Phone: [phone] ?

## 2022-04-01 NOTE — Telephone Encounter (Signed)
Left message for a return call

## 2022-04-02 ENCOUNTER — Telehealth: Payer: Self-pay | Admitting: Neurology

## 2022-04-02 NOTE — Telephone Encounter (Signed)
Referral for Neurology sent to Dr. Yetta Numbers [phone]. ?

## 2022-04-02 NOTE — Telephone Encounter (Addendum)
I spoke to the patient. He verbalized understanding of the lab results. Right now, he is uninsured. He is working to get a Risk analyst approved through [employer]. He is also trying to get some type of private insurance coverage. ? ?Per vo by Dr. Terrace Arabia, he may hold off on the single fiber EMG for right now while he is attempting to find coverage. However, she will need to clarify his diagnosis in order to provide the most effective treatment. Workup with the neuromuscular specialist and single fiber EMG will be needed as soon as he can work it out. ? ?I also provided the the [facility] billing department [phone]). He will contact them to see if they offer any type of financial assistance. He would like to keep the pending referral. Hopefully, he can get it worked out to be seen. He will keep [place] updated.  ? ?He will call us back if his symptoms worsen or he has other questions.  ? ? ?

## 2022-04-06 NOTE — Telephone Encounter (Signed)
April 02, 2022 ?John Sims ?  ?AB ?   4:19 PM ?Note ?Referral for Neurology sent to Dr. Zettie Cooley [phone].  ?  ? ?

## 2022-09-29 ENCOUNTER — Telehealth: Payer: Self-pay | Admitting: Neurology

## 2022-09-29 ENCOUNTER — Ambulatory Visit: Payer: Self-pay | Admitting: Neurology

## 2022-09-29 DIAGNOSIS — Z0289 Encounter for other administrative examinations: Secondary | ICD-10-CM

## 2022-09-29 NOTE — Telephone Encounter (Signed)
Pt cancelled appt due to having no insurance at this time. Transferred pt to Billing.

## 2024-01-12 ENCOUNTER — Ambulatory Visit
Admission: EM | Admit: 2024-01-12 | Discharge: 2024-01-12 | Disposition: A | Discharge status: Home / Self Care | Payer: Self-pay | Attending: Family Medicine | Admitting: Family Medicine

## 2024-01-12 DIAGNOSIS — L089 Local infection of the skin and subcutaneous tissue, unspecified: Secondary | ICD-10-CM

## 2024-01-12 MED ORDER — IBUPROFEN 800 MG PO TABS: 800.0000 mg | ORAL_TABLET | Freq: Three times a day (TID) | ORAL | 0 refills | Status: AC

## 2024-01-12 MED ORDER — DOXYCYCLINE HYCLATE 100 MG PO CAPS: 100.0000 mg | ORAL_CAPSULE | Freq: Two times a day (BID) | ORAL | 0 refills | Status: DC

## 2024-01-12 NOTE — ED Provider Notes (Signed)
[facility]    CSN: [id number] Arrival date & time: 01/12/24  4917      History   Chief Complaint No chief complaint on file.   HPI John Sims is a 28 y.o. male.   Presenting today with redness swelling and pain to the right index finger after having a piece of metal poked into his finger at work.  States he was able to remove the piece of metal this morning but still having pain, swelling to the area.  Denies drainage, bleeding, fever, chills, numbness, tingling.  Last tetanus shot was about 7 years ago.    History reviewed. No pertinent past medical history.  Patient Active Problem List   Diagnosis Date Noted   Diplopia 03/18/2022   Myasthenia gravis (HCC) 03/18/2022    History reviewed. No pertinent surgical history.     Home Medications    Prior to Admission medications   Medication Sig Start Date End Date Taking? Authorizing Provider  doxycycline (VIBRAMYCIN) 100 MG capsule Take 1 capsule (100 mg total) by mouth 2 (two) times daily. 01/12/24  Yes Particia Nearing, PA-C  ibuprofen (ADVIL) 800 MG tablet Take 1 tablet (800 mg total) by mouth 3 (three) times daily. 01/12/24  Yes Particia Nearing, PA-C  pyridostigmine (MESTINON) 60 MG tablet Take 1 tablet (60 mg total) by mouth 3 (three) times daily. Patient taking differently: Take 120 mg by mouth 3 (three) times daily. 03/18/22   Levert Feinstein, MD    Family History History reviewed. No pertinent family history.  Social History Social History   Tobacco Use   Smoking status: Never   Smokeless tobacco: Never  Vaping Use   Vaping status: Never Used  Substance Use Topics   Alcohol use: Yes    Comment: occ   Drug use: Never     Allergies   Patient has no known allergies.   Review of Systems Review of Systems PER HPI  Physical Exam Triage Vital Signs ED Triage Vitals  Encounter Vitals Group     BP 01/12/24 1127 138/80     Systolic BP Percentile --      Diastolic BP  Percentile --      Pulse Rate 01/12/24 1127 86     Resp 01/12/24 1127 17     Temp 01/12/24 1127 (!) 97.5 F (36.4 C)     Temp Source 01/12/24 1127 Oral     SpO2 01/12/24 1127 97 %     Weight --      Height --      Head Circumference --      Peak Flow --      Pain Score 01/12/24 1130 5     Pain Loc --      Pain Education --      Exclude from Growth Chart --    No data found.  Updated Vital Signs BP 138/80 (BP Location: Right Arm)   Pulse 86   Temp (!) 97.5 F (36.4 C) (Oral)   Resp 17   SpO2 97%   Visual Acuity Right Eye Distance:   Left Eye Distance:   Bilateral Distance:    Right Eye Near:   Left Eye Near:    Bilateral Near:     Physical Exam Vitals and nursing note reviewed.  Constitutional:      Appearance: Normal appearance.  HENT:     Head: Atraumatic.  Eyes:     Extraocular Movements: Extraocular movements intact.     Conjunctiva/sclera: Conjunctivae normal.  Cardiovascular:     Rate and Rhythm: Normal rate and regular rhythm.  Pulmonary:     Effort: Pulmonary effort is normal.     Breath sounds: Normal breath sounds.  Musculoskeletal:        General: Swelling, tenderness and signs of injury present. No deformity. Normal range of motion.     Cervical back: Normal range of motion and neck supple.     Comments: Distal right index finger erythematous, edematous, tender to palpation.  Skin:    General: Skin is warm and dry.     Findings: Erythema present.     Comments: No foreign body appreciable on exam.  Nonindurated, nonfluctuant, no drainage or bleeding  Neurological:     General: No focal deficit present.     Mental Status: He is oriented to person, place, and time.     Motor: No weakness.     Gait: Gait normal.     Comments: Right hand neurovascularly intact  Psychiatric:        Mood and Affect: Mood normal.        Thought Content: Thought content normal.        Judgment: Judgment normal.      UC Treatments / Results  Labs (all labs  ordered are listed, but only abnormal results are displayed) Labs Reviewed - No data to display  EKG   Radiology No results found.  Procedures Procedures (including critical care time)  Medications Ordered in UC Medications - No data to display  Initial Impression / Assessment and Plan / UC Course  I have reviewed the triage vital signs and the nursing notes.  Pertinent labs & imaging results that were available during my care of the patient were reviewed by me and considered in my medical decision making (see chart for details).     Treat with doxycycline, Epsom salt soaks, over-the-counter pain relievers.  Declines tetanus shot today.  Final Clinical Impressions(s) / UC Diagnoses   Final diagnoses:  Finger infection   Discharge Instructions   None    ED Prescriptions     Medication Sig Dispense Auth. Provider   doxycycline (VIBRAMYCIN) 100 MG capsule Take 1 capsule (100 mg total) by mouth 2 (two) times daily. 14 capsule Particia Nearing, [place]   ibuprofen (ADVIL) 800 MG tablet Take 1 tablet (800 mg total) by mouth 3 (three) times daily. 21 tablet Particia Nearing, [place]      PDMP not reviewed this encounter.   Particia Nearing, [place] 01/12/24 1156

## 2024-01-12 NOTE — ED Triage Notes (Signed)
Pt reports possible infection to right index finger. Pt recalls he works in [employer] and 2 days ago pt was work and a sliver of metal entered into his finger. States he was able to remover the metal but has noticed pain and swelling I the digit since the incident took place.

## 2024-09-09 ENCOUNTER — Ambulatory Visit
Admission: EM | Admit: 2024-09-09 | Discharge: 2024-09-09 | Disposition: A | Discharge status: Home / Self Care | Attending: Internal Medicine | Admitting: Internal Medicine

## 2024-09-09 DIAGNOSIS — L02414 Cutaneous abscess of left upper limb: Secondary | ICD-10-CM | POA: Diagnosis not present

## 2024-09-09 DIAGNOSIS — L03114 Cellulitis of left upper limb: Secondary | ICD-10-CM

## 2024-09-09 MED ORDER — BACITRACIN ZINC 500 UNIT/GM EX OINT: TOPICAL_OINTMENT | Freq: Once | CUTANEOUS | Status: AC

## 2024-09-09 MED ORDER — DOXYCYCLINE HYCLATE 100 MG PO CAPS: 100.0000 mg | ORAL_CAPSULE | Freq: Two times a day (BID) | ORAL | 0 refills | Status: AC

## 2024-09-09 MED ORDER — MUPIROCIN 2 % EX OINT: 1.0000 | TOPICAL_OINTMENT | Freq: Two times a day (BID) | CUTANEOUS | 0 refills | Status: AC

## 2024-09-09 NOTE — ED Provider Notes (Signed)
 [facility]    CSN: [id number] Arrival date & time: 09/09/24  0802      History   Chief Complaint Chief Complaint  Patient presents with   Arm Problem    HPI John Sims is a 28 y.o. male.   John Sims is a 28 y.o. male presenting for chief complaint of abscess and cellulitis of the left arm that he first noticed approximately 4 days ago.  Lesion began as a small pimple, pimple popped, then he developed abscess/worsening redness and swelling around the lesion gradually over the last 3 to 4 days.  Reports pain, erythema, swelling, and warmth associated with lesion.  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  Denies recent antibiotic or steroid use.  Denies fever, chills, nausea, vomiting, and bodyaches.  He is using Epsom salt soaks and Aleve  to help with pain and swelling with minimal relief.  He notes scant purulent drainage from the abscess.     History reviewed. No pertinent past medical history.  Patient Active Problem List   Diagnosis Date Noted   Diplopia 03/18/2022   Myasthenia gravis (HCC) 03/18/2022    History reviewed. No pertinent surgical history.     Home Medications    Prior to Admission medications   Medication Sig Start Date End Date Taking? Authorizing Provider  mupirocin ointment (BACTROBAN) 2 % Apply 1 Application topically 2 (two) times daily. 09/09/24  Yes Enedelia Dorna HERO, FNP  doxycycline  (VIBRAMYCIN ) 100 MG capsule Take 1 capsule (100 mg total) by mouth 2 (two) times daily for 10 days. 09/09/24 09/19/24  Enedelia Dorna HERO, FNP  ibuprofen  (ADVIL ) 800 MG tablet Take 1 tablet (800 mg total) by mouth 3 (three) times daily. 01/12/24   Stuart Vernell Norris, PA-C  pyridostigmine  (MESTINON ) 60 MG tablet Take 1 tablet (60 mg total) by mouth 3 (three) times daily. Patient taking differently: Take 120 mg by mouth 3 (three) times daily. 03/18/22   Onita Duos, MD    Family History History reviewed. No pertinent family history.  Social History Social History   Tobacco Use   Smoking status: Never   Smokeless tobacco: Never  Vaping Use   Vaping status: Never Used  Substance Use Topics   Alcohol use: Yes    Comment: occ   Drug use: Never     Allergies   Patient has no known allergies.   Review of Systems Review of Systems Per HPI  Physical Exam Triage Vital Signs ED Triage Vitals  Encounter Vitals Group     BP 09/09/24 0815 132/87     Girls Systolic BP Percentile --      Girls Diastolic BP Percentile --      Boys Systolic BP Percentile --      Boys Diastolic BP Percentile --      Pulse Rate 09/09/24 0815 91     Resp 09/09/24 0815 18     Temp 09/09/24 0815 98.2 F (36.8 C)     Temp Source 09/09/24 0815 Oral     SpO2 09/09/24 0815 97 %     Weight --      Height --      Head Circumference --      Peak Flow --      Pain Score 09/09/24 0817 0     Pain Loc --      Pain Education --  Exclude from Growth Chart --    No data found.  Updated Vital Signs BP 132/87   Pulse 91   Temp 98.2 F (36.8 C) (Oral)   Resp 18   SpO2 97%   Visual Acuity Right Eye Distance:   Left Eye Distance:   Bilateral Distance:    Right Eye Near:   Left Eye Near:    Bilateral Near:     Physical Exam Vitals and nursing note reviewed.  Constitutional:      Appearance: He is not ill-appearing or toxic-appearing.  HENT:     Head: Normocephalic and atraumatic.     Right Ear: Hearing and external ear normal.     Left Ear: Hearing and external ear normal.     Nose: Nose normal.     Mouth/Throat:     Lips: Pink.  Eyes:     General: Lids are normal. Vision grossly intact. Gaze aligned appropriately.     Extraocular Movements: Extraocular movements intact.     Conjunctiva/sclera: Conjunctivae normal.  Pulmonary:     Effort: Pulmonary effort is normal.  Musculoskeletal:     Cervical back: Neck supple.  Skin:    General:  Skin is warm and dry.     Capillary Refill: Capillary refill takes less than 2 seconds.     Findings: Abscess and erythema present. No rash.     Comments: Abscess and cellulitis of the left arm.  Cellulitis surrounding abscess measures 5 cm x 6 cm.  Central abscess/induration present.  Nonfluctuant.  Very tender to palpation.  +2 left radial pulse.  Sensation intact to distal left upper extremity.  Neurological:     General: No focal deficit present.     Mental Status: He is alert and oriented to person, place, and time. Mental status is at baseline.     Cranial Nerves: No dysarthria or facial asymmetry.  Psychiatric:        Mood and Affect: Mood normal.        Speech: Speech normal.        Behavior: Behavior normal.        Thought Content: Thought content normal.        Judgment: Judgment normal.      UC Treatments / Results  Labs (all labs ordered are listed, but only abnormal results are displayed) Labs Reviewed - No data to display  EKG   Radiology No results found.  Procedures Procedures (including critical care time)  Medications Ordered in UC Medications  bacitracin ointment (has no administration in time range)    Initial Impression / Assessment and Plan / UC Course  I have reviewed the triage vital signs and the nursing notes.  Pertinent labs & imaging results that were available during my care of the patient were reviewed by me and considered in my medical decision making (see chart for details).   1.  Abscess of left arm, cellulitis of left arm Abscess and cellulitis of left arm on exam. Abscess is immature and therefore incision and drainage is not attempted today. Doxycycline  twice daily for 10 days ordered.  Mupirocin topically twice daily for 7 days ordered. Wound care discussed, wound cleansed and dressed in clinic with bacitracin and nonstick gauze/Coban wrap. Discussed signs of infection/return to clinic precautions. Wound outlined with skin pen to  monitor for new/worsening erythema. Continue Epsom salt soaks and Aleve  as needed for pain/swelling.  Counseled patient on potential for adverse effects with medications prescribed/recommended today, strict ER and return-to-clinic precautions discussed,  patient verbalized understanding.    Final Clinical Impressions(s) / UC Diagnoses   Final diagnoses:  Abscess of arm, left     Discharge Instructions      You have an abscess and cellulitis of the left arm.  Continue warm compresses to help with firm infected material to the abscess.  Take antibiotic every 12 hours with a snack/food for the next 7 days. Watch for worsening signs of infection to the lesion such as spreading redness, swelling, pus drainage, pain, fever/chills. I have drawn a circle around the area of redness with a skin pen today, if the redness grows past the circle significantly in the next 24-48 hours, please return to urgent care for reevaluation.  Return if you develop fever, chills, nausea, vomiting, etc as well.  If you develop any new or worsening symptoms or if your symptoms do not start to improve, please return here or follow-up with your primary care provider. If your symptoms are severe, please go to the emergency room.   ED Prescriptions     Medication Sig Dispense Auth. Provider   doxycycline  (VIBRAMYCIN ) 100 MG capsule Take 1 capsule (100 mg total) by mouth 2 (two) times daily for 10 days. 20 capsule [place] M, FNP   mupirocin ointment (BACTROBAN) 2 % Apply 1 Application topically 2 (two) times daily. 22 g Enedelia Dorna HERO, FNP      PDMP not reviewed this encounter.   Enedelia Dorna [place], [place] 09/09/24 [id number]

## 2024-09-09 NOTE — ED Triage Notes (Signed)
 Pt reports he has an old abscess on his left forearm that has opened up x 4 days. States he believes it is infected.

## 2024-09-09 NOTE — Discharge Instructions (Addendum)
 You have an abscess and cellulitis of the left arm.  Continue warm compresses to help with firm infected material to the abscess.  Take antibiotic every 12 hours with a snack/food for the next 7 days. Watch for worsening signs of infection to the lesion such as spreading redness, swelling, pus drainage, pain, fever/chills. I have drawn a circle around the area of redness with a skin pen today, if the redness grows past the circle significantly in the next 24-48 hours, please return to urgent care for reevaluation.  Return if you develop fever, chills, nausea, vomiting, etc as well.  If you develop any new or worsening symptoms or if your symptoms do not start to improve, please return here or follow-up with your primary care provider. If your symptoms are severe, please go to the emergency room.
